# Patient Record
Sex: Female | Born: 1965 | Race: White | Hispanic: No | Marital: Single
Health system: Midwestern US, Community
[De-identification: ages and names within clinical notes are randomized; demographics above are authoritative.]

## PROBLEM LIST (undated history)

## (undated) DIAGNOSIS — K5900 Constipation, unspecified: Secondary | ICD-10-CM

## (undated) DIAGNOSIS — M7918 Myalgia, other site: Secondary | ICD-10-CM

## (undated) DIAGNOSIS — Z22322 Carrier or suspected carrier of Methicillin resistant Staphylococcus aureus: Secondary | ICD-10-CM

## (undated) DIAGNOSIS — M1990 Unspecified osteoarthritis, unspecified site: Secondary | ICD-10-CM

## (undated) HISTORY — DX: Carrier or suspected carrier of methicillin resistant Staphylococcus aureus: Z22.322

## (undated) HISTORY — DX: Myalgia, other site: M79.18

## (undated) HISTORY — PX: CARPAL TUNNEL RELEASE: SHX101

## (undated) HISTORY — DX: Constipation, unspecified: K59.00

## (undated) HISTORY — DX: Unspecified osteoarthritis, unspecified site: M19.90

---

## 2003-09-07 HISTORY — PX: TUBAL LIGATION: SHX77

## 2011-09-07 HISTORY — PX: HAND DEBRIDEMENT: SHX974

## 2013-09-06 HISTORY — PX: TRIGGER FINGER RELEASE: SHX641

## 2014-09-10 ENCOUNTER — Inpatient Hospital Stay
Admit: 2014-09-10 | Payer: BLUE CROSS/BLUE SHIELD | Attending: Rehabilitative and Restorative Service Providers" | Primary: Family Medicine

## 2014-09-10 DIAGNOSIS — M791 Myalgia: Secondary | ICD-10-CM

## 2014-09-10 NOTE — Progress Notes (Signed)
PT DAILY TREATMENT NOTE 8-14    Patient Name: Erika Duran  Date:09/10/2014  DOB: [birth year]    Patient DOB Verified  Payor: BLUE CROSS / Plan: [place] PPO / Product Type: PPO /    In time:201ut time:300  Total Treatment Time (min): 9  Visit #: 1of 15    Treatment Area: Myofascial pain syndrome, cervical [M79.1]    SUBJECTIVE  Pain Level (0-10 scale): 5/10  Any medication changes, allergies to medications, adverse drug reactions, diagnosis change, or new procedure performed?:  No     Yes (see summary sheet for update)  Subjective functional status/changes:    No changes reported  HPI: Pt c/o increased pressure in B ears, R>L. Reports she also suffers from headaches. States this has been an on and off thing for 20 years or more but that she had a flare up recently in the last year. States she was cleared w/ hearing and was told her ears were too clean. She was given wax to put in them 2x/day. Reports a heavy feeling in her ears that increase randomly and decrease w/ ice or heat and medications. Denies previous treatment for this or her neck/headaches. Denies previous surgery.     OBJECTIVE    Modality rationale: decrease inflammation, decrease pain and increase tissue extensibility to improve the patient???s ability to improve mobility    Min Type Additional Details     Estim: Att   Unatt        TENS instruct                  IFC  Premod   NMES                     Other:  w/US   w/ice   w/heat  Position:  Location:      Traction:  Cervical       Lumbar                        Prone          Supine                       Intermittent   Continuous Lbs:   before manual   after manual      Ultrasound: Continuous    Pulsed                           1MHz   3MHz Location:  W/cm2:      Iontophoresis with dexamethasone         Location:  Take home patch    In clinic   10   Ice       heat    Ice massage Position: supine  Location: neck      Laser    Other: Position:  Location:       Vasopneumatic Device Pressure:        lo  med  hi   Temperature:  lo  med  hi    Skin assessment post-treatment:  intact redness- no adverse reaction    redness ??? adverse reaction:     10 min Therapeutic Exercise:   See flow sheet : instructed in and demo'd HEP   Rationale: increase ROM, improve coordination and increase proprioception to improve the patient???s ability to decrease pain      min Therapeutic Activity:  See flow sheet :   Rationale:   to improve the patient???s ability to       min Neuromuscular Re-education:    See flow sheet :   Rationale:   to improve the patient???s ability to     25 min Manual Therapy:  SOR, MFR to B UT, LS, scalenes, and SCMs, TPR to B SCM and scalenes, MET for 1st rib elevation B   Rationale: decrease pain, increase ROM, increase tissue extensibility, decrease trigger points and increase postural awareness to improve function and mobility     min Gait Training:  ___ feet with ___ device on level surfaces with ___ level of assist   Rationale:          With    TE    TA    neuro    other: Patient Education:  Review HEP     Progressed/Changed HEP based on:    positioning    body mechanics    transfers    heat/ice application     other:      Other Objective/Functional Measures: Pt presents w/ rounded shoulders and forward head posture. Increased UT activation noted at rest. Cervical AROM Flex and ext WNL, R rot 50 pain free, 60 deg w/pain, L rot 62, R SB 46, L SB 32. B shoulder AROM WFL w/ pulling across her shoulders w/ flexion and ER behind the head. Increased myofascial restrictions noted throughout the cervical musculature B L>R. Compression of the R SCM TrP at the mastoid reproduced the ear heaviness described by the patient. Noted elevated 1st ribs B, and fair segmental mobility throughout the c/s.     Pain Level (0-10 scale) post treatment: 2/10    ASSESSMENT/Changes in Function: Pt responded well to manual listed above  and would benefit from dry needling to decrease active TrP in the cervical musculature. Focus on improving ROM and decreasing pain to improve function.     Patient will continue to benefit from skilled PT services to modify and progress therapeutic interventions, address functional mobility deficits, address ROM deficits, analyze and address soft tissue restrictions, analyze and cue movement patterns, assess and modify postural abnormalities and instruct in home and community integration to attain remaining goals.       See Plan of Care    See progress note/recertification    See Discharge Summary         Progress towards goals / Updated goals:  See POC    PLAN    Upgrade activities as tolerated       Continue plan of care    Update interventions per flow sheet         Discharge due to:_    Other: 3x/5 weeks    Barrie Dunker, PT 09/10/2014  3:55 PM

## 2014-09-10 NOTE — Progress Notes (Signed)
[facility] ??? [facility]              [address]        [place], [place] [address]  [phone]   [phone] fax    Plan of Care/ Statement of Necessity for Physical Therapy Services  Patient name: Erika Duran Start of Care: 09/10/2014   Referral source: Erika Cornea, MD DOB: 1966-03-19    Medical Diagnosis: Myofascial pain syndrome, cervical [M79.1]   Onset Date:1 year ago    Treatment Diagnosis: cervical myofascial pain   Prior Hospitalization: see medical history Provider#: [id number]   Medications: Verified on Patient summary List    Comorbidities: allergies, headaches   Prior Level of Function: Hx HA and ear infections     The Plan of Care and following information is based on the information from the initial evaluation.  Assessment/ key information: Pt is a 49 y/o female w/c/o increased pressure in B ears, R>L. Reports she also suffers from headaches. States this has been an on and off thing for 20 years or more but that she had a flare up recently in the last year. States she was cleared w/ hearing and was told her ears were too clean. She was given wax to put in them 2x/day. Reports a heavy feeling in her ears that increase randomly and decrease w/ ice or heat and medications. Denies previous treatment for this or her neck/headaches. Denies previous surgery. Pt presents w/ rounded shoulders and forward head posture. Increased UT activation noted at rest. Cervical AROM Flex and ext WNL, R rot 50 pain free, 60 deg w/pain, L rot 62, R SB 46, L SB 32. B shoulder AROM WFL w/ pulling across her shoulders w/ flexion and ER behind the head. Increased myofascial restrictions noted throughout the cervical musculature B L>R. Compression of the R SCM TrP at the mastoid reproduced the ear heaviness described by the patient. Noted elevated 1st ribs B, and fair segmental mobility throughout the c/s. Pt will benefit from skilled PT to address the deficits and progress with pt goals.      Problem List: pain affecting function, decrease ROM, decrease ADL/ functional abilitiies, decrease activity tolerance and decrease flexibility/ joint mobility   Treatment Plan may include any combination of the following: Therapeutic exercise, Therapeutic activities, Neuromuscular re-education, Physical agent/modality, Manual therapy and Patient education  Patient / Family readiness to learn indicated by: asking questions, trying to perform skills and interest  Persons(s) to be included in education: patient (P)  Barriers to Learning/Limitations: no  Patient Goal (s): ???relief???  Patient Self Reported Health Status: good  Rehabilitation Potential: good    Short Term Goals: To be accomplished in 2  weeks:  1. Pt will be independent and compliant w/ HEP to progress gains in PT.   2. Pt will report > or = to 25% improvement in ear symptoms for ease w/ ADLs.     Long Term Goals: To be accomplished in 5  weeks:  1. Pt will improve FOTO to > or = to 75 to demo improved function.   2. Pt will increase cervical AROM into L SB and R rotation by > or = to 8-10 deg and no increased pain for ease w/ driving.   3. Pt will improve cervical segmental mobility to good for ease w/ ADLs.   4. Pt will report > or = to 50% improvement in symptoms for ease w/ work tasks.    Frequency /  Duration: Patient to be seen 3 times per week for 5  weeks.    Patient/ Caregiver education and instruction: Diagnosis, prognosis, activity modification and exercises     Plan of care has been reviewed with PTA    Erika HeadingsJennifer  Jazari Duran, PT 09/10/2014 4:50 PM  ________________________________________________________________________    I certify that the above Therapy Services are being furnished while the patient is under my care. I agree with the treatment plan and certify that this therapy is necessary.    Physician's Signature:____________________  Date:____________Time: _________    Please sign and return to [facility] ??? [facility]               [address]        [place], [place] [id number]  [phone]   [phone] fax

## 2014-09-16 ENCOUNTER — Inpatient Hospital Stay: Admit: 2014-09-16 | Payer: BLUE CROSS/BLUE SHIELD | Primary: Family Medicine

## 2014-09-16 NOTE — Progress Notes (Signed)
PT DAILY TREATMENT NOTE 8-14    Patient Name: Erika Duran  Date:09/16/2014  DOB: 1966/02/11    Patient DOB Verified  Payor: BLUE CROSS / Plan: [place] PPO / Product Type: PPO /    In time:10:00  Out time:10:30  Total Treatment Time (min): 30  Visit #: 2 of 15    Treatment Area: Myofascial pain syndrome, cervical [M79.1]    SUBJECTIVE  Pain Level (0-10 scale): 5/10  Any medication changes, allergies to medications, adverse drug reactions, diagnosis change, or new procedure performed?:  No     Yes (see summary sheet for update)  Subjective functional status/changes:    No changes reported  "Yesterday it was a bad day, so I didn't do my ex."    OBJECTIVE    20 min Therapeutic Exercise:   See flow sheet :stretching and strengthening ex.   Rationale: increase ROM and increase strength to improve the patient???s ability to perform ADLs without difficulty.    10 min Manual Therapy:  Per flow sheet.   Rationale: decrease pain, increase ROM, increase tissue extensibility and decrease trigger points to perform ADLs without difficulty.     min Gait Training:  ___ feet with ___ device on level surfaces with ___ level of assist   Rationale:          With    TE    TA    neuro    other: Patient Education:  Review HEP     Progressed/Changed HEP based on:    positioning    body mechanics    transfers    heat/ice application     other:      Other Objective/Functional Measures:      Pain Level (0-10 scale) post treatment: 2/10    ASSESSMENT/Changes in Function: Initiated ex. Per flow sheet.  Pt reported a decrease in p! After therapy today.    Patient will continue to benefit from skilled PT services to address functional mobility deficits, address ROM deficits and address strength deficits to attain remaining goals.       See Plan of Care    See progress note/recertification    See Discharge Summary         Progress towards goals / Updated goals:    Short Term Goals: To be accomplished in 2?? weeks:   1. Pt will be independent and compliant w/ HEP to progress gains in PT. ??  MET. 09/16/14  2. Pt will report > or = to 25% improvement in ear symptoms for ease w/ ADLs.     Long Term Goals: To be accomplished in 5?? weeks:  1. Pt will improve FOTO to > or = to 75 to demo improved function. ??  2. Pt will increase cervical AROM into L SB and R rotation by > or = to 8-10 deg and no increased pain for ease w/ driving. ??  3. Pt will improve cervical segmental mobility to good for ease w/ ADLs. ??  4. Pt will report > or = to 50% improvement in symptoms for ease w/ work tasks.  PLAN    Upgrade activities as tolerated       Continue plan of care    Update interventions per flow sheet         Discharge due to:_    Other:_      Antionette Fairy, PTA 09/16/2014  10:22 AM

## 2014-09-17 ENCOUNTER — Inpatient Hospital Stay
Admit: 2014-09-17 | Payer: BLUE CROSS/BLUE SHIELD | Attending: Rehabilitative and Restorative Service Providers" | Primary: Family Medicine

## 2014-09-17 NOTE — Progress Notes (Signed)
PT DAILY TREATMENT NOTE 8-14    Patient Name: Erika Duran  Date:09/17/2014  DOB: 04-15-1966    Patient DOB Verified  Payor: BLUE CROSS / Plan: [place] PPO / Product Type: PPO /    In time:5:27  Out time:6:24  Total Treatment Time (min): 51  Visit #: 3 of 15    Treatment Area: Myofascial pain syndrome, cervical [M79.1]    SUBJECTIVE  Pain Level (0-10 scale): 10  Any medication changes, allergies to medications, adverse drug reactions, diagnosis change, or new procedure performed?:  No     Yes (see summary sheet for update)  Subjective functional status/changes:    No changes reported  Pt reported increase in P! This session. Pt states that progress is being made but notes that there are still flucuations in P! Levels throughout duration of day.     OBJECTIVE    Modality rationale: decrease inflammation, decrease pain and increase tissue extensibility to improve the patient???s ability to modulate pain, improve ROm, and relax tissues   Min Type Additional Details     Estim: Att   Unatt        TENS instruct                  IFC  Premod   NMES                     Other:  w/US   w/ice   w/heat  Position:  Location:      Traction:  Cervical       Lumbar                        Prone          Supine                       Intermittent   Continuous Lbs:   before manual   after manual      Ultrasound: Continuous    Pulsed                           1MHz   3MHz Location:  W/cm2:      Iontophoresis with dexamethasone         Location:  Take home patch    In clinic   10   Ice       heat    Ice massage Position:supine  Location:C-spine, Trap      Laser    Other: Position:  Location:      Vasopneumatic Device Pressure:        lo  med  hi   Temperature:  lo  med  hi    Skin assessment post-treatment:  intact redness- no adverse reaction    redness ??? adverse reaction:      32 min Therapeutic Exercise:   See flow sheet :   Rationale: increase ROM and increase strength to improve the patient???s  ability to function with ADLs and work.      min Therapeutic Activity:    See flow sheet :   Rationale:   to improve the patient???s ability to       min Neuromuscular Re-education:    See flow sheet :   Rationale:   to improve the patient???s ability to     15 min Manual Therapy:  SOR, MFR/TPR to Bilat SCM, scalene, longus coli,  UT; C/S PAs and downglides   Rationale: increase ROM, increase tissue extensibility, decrease trigger points and increase postural awareness to improve pt awareness of posture, improve C-spine ROM, and to improve P!.      min Gait Training:  ___ feet with ___ device on level surfaces with ___ level of assist   Rationale:          With    TE    TA    neuro    other: Patient Education:  Review HEP     Progressed/Changed HEP based on:    positioning    body mechanics    transfers    heat/ice application     other:      Other Objective/Functional Measures:      Pain Level (0-10 scale) post treatment: 0    ASSESSMENT/Changes in Function: Pt reported significant decrease in P! Following therapeutic interventions. Pt noted improved ROM of neck and decreased stiffness.     Patient will continue to benefit from skilled PT services to address functional mobility deficits, address ROM deficits and address strength deficits to attain remaining goals.       See Plan of Care    See progress note/recertification    See Discharge Summary         Progress towards goals / Updated goals:  Short Term Goals: To be accomplished in 2?? weeks:  1. Pt will be independent and compliant w/ HEP to progress gains in PT. ??  MET. 09/16/14  2. Pt will report > or = to 25% improvement in ear symptoms for ease w/ ADLs.     Long Term Goals: To be accomplished in 5?? weeks:  1. Pt will improve FOTO to > or = to 75 to demo improved function. ??  2. Pt will increase cervical AROM into L SB and R rotation by > or = to 8-10 deg and no increased pain for ease w/ driving. ??   3. Pt will improve cervical segmental mobility to good for ease w/ ADLs. ??  4. Pt will report > or = to 50% improvement in symptoms for ease w/ work tasks.    PLAN    Upgrade activities as tolerated       Continue plan of care    Update interventions per flow sheet         Discharge due to:_    Other:_      Nancy Fetter 09/17/2014  6:17 PM

## 2014-09-19 ENCOUNTER — Inpatient Hospital Stay: Admit: 2014-09-19 | Payer: BLUE CROSS/BLUE SHIELD | Primary: Family Medicine

## 2014-09-19 NOTE — Progress Notes (Signed)
PT DAILY TREATMENT NOTE 8-14    Patient Name: Erika Duran  Date:09/19/2014  DOB: 03/04/1966    Patient DOB Verified  Payor: BLUE CROSS / Plan: [place] PPO / Product Type: PPO /    In time:1:58 Out time: 2:58  Total Treatment Time (min): 60  Visit #: 4 of 15    Treatment Area: Myofascial pain syndrome, cervical [M79.1]    SUBJECTIVE  Pain Level (0-10 scale): 0  Any medication changes, allergies to medications, adverse drug reactions, diagnosis change, or new procedure performed?:  No     Yes (see summary sheet for update)  Subjective functional status/changes:    No changes reported  Reports "I have a lingering HA in the back of my head but it is not bad".     OBJECTIVE    Modality rationale: decrease inflammation, decrease pain and increase tissue extensibility to improve the patient???s ability to perform work activities   Min Type Additional Details     Estim: Att   Unatt        TENS instruct                  IFC  Premod   NMES                     Other:  w/US   w/ice   w/heat  Position:  Location:      Traction:  Cervical       Lumbar                        Prone          Supine                       Intermittent   Continuous Lbs:   before manual   after manual      Ultrasound: Continuous    Pulsed                           1MHz   3MHz Location:  W/cm2:      Iontophoresis with dexamethasone         Location:  Take home patch    In clinic   10   Ice       heat    Ice massage Position: Seated  Location:neck      Laser    Other: Position:  Location:      Vasopneumatic Device Pressure:        lo  med  hi   Temperature:  lo  med  hi    Skin assessment post-treatment:  intact redness- no adverse reaction    redness ??? adverse reaction:      40 min Therapeutic Exercise:   See flow sheet :   Rationale: increase ROM and increase strength to improve the patient's ability to perform ADLs.     10 min Manual Therapy:  SOR, MFR/TPR to B SCM, LS and UT, C/S PAs    Rationale: increase ROM, increase tissue extensibility, decrease trigger points and increase postural awareness to improve tolerance to functional activities          With    TE    TA    neuro    other: Patient Education:  Review HEP     Progressed/Changed HEP based on:    positioning    body mechanics  transfers    heat/ice application     other:      Other Objective/Functional Measures: Tightness and trigger points noted during manual therapy in B UTs.      Pain Level (0-10 scale) post treatment: 0    ASSESSMENT/Changes in Function: no increase in pain with exercises performed today. Reported "I feel more loose" after manual therapy. Reported that the "lingering headache" the pt had at the beginning of the session "is mostly gone now" at the end of the session. Continue POC as tolerated.     Patient will continue to benefit from skilled PT services to address functional mobility deficits, address ROM deficits and address strength deficits to attain remaining goals.       See Plan of Care    See progress note/recertification    See Discharge Summary         Progress towards goals / Updated goals:  Short Term Goals: To be accomplished in 2?? weeks:  1. Pt will be independent and compliant w/ HEP to progress gains in PT. ??  MET. 09/16/14  2. Pt will report > or = to 25% improvement in ear symptoms for ease w/ ADLs.   Long Term Goals: To be accomplished in 5?? weeks:  1. Pt will improve FOTO to > or = to 75 to demo improved function. ??  2. Pt will increase cervical AROM into L SB and R rotation by > or = to 8-10 deg and no increased pain for ease w/ driving. ??  3. Pt will improve cervical segmental mobility to good for ease w/ ADLs. ??  4. Pt will report > or = to 50% improvement in symptoms for ease w/ work tasks.    PLAN    Upgrade activities as tolerated       Continue plan of care    Update interventions per flow sheet         Discharge due to:_    Other:_      Stephanie Coup, PT 09/19/2014  2:00 PM

## 2014-09-24 ENCOUNTER — Inpatient Hospital Stay: Admit: 2014-09-24 | Payer: BLUE CROSS/BLUE SHIELD | Primary: Family Medicine

## 2014-09-24 NOTE — Progress Notes (Signed)
PT DAILY TREATMENT NOTE 8-14    Patient Name: Erika Duran  Date:09/24/2014  DOB: [birth year]    Patient DOB Verified  Payor: BLUE CROSS / Plan: [place] PPO / Product Type: PPO /    In time:12:27  Out time:1:31  Total Treatment Time (min): 64  Visit #: 5 of 15    Treatment Area: Myofascial pain syndrome, cervical [M79.1]    SUBJECTIVE  Pain Level (0-10 scale): 0/10  Any medication changes, allergies to medications, adverse drug reactions, diagnosis change, or new procedure performed?:  No     Yes (see summary sheet for update)  Subjective functional status/changes:    No changes reported  Pt states she is sore from overstretching her arm the other day.     OBJECTIVE    Modality rationale: decrease pain to improve the patient???s ability to perform ADLs.    Min Type Additional Details     Estim: Att   Unatt        TENS instruct                  IFC  Premod   NMES                     Other:  w/US   w/ice   w/heat  Position:  Location:      Traction:  Cervical       Lumbar                        Prone          Supine                       Intermittent   Continuous Lbs:   before manual   after manual      Ultrasound: Continuous    Pulsed                           1MHz   3MHz Location:  W/cm2:      Iontophoresis with dexamethasone         Location:  Take home patch    In clinic   9   Ice       heat    Ice massage Position: supine  Location: c/s      Laser    Other: Position:  Location:      Vasopneumatic Device Pressure:        lo  med  hi   Temperature:  lo  med  hi    Skin assessment post-treatment:  intact redness- no adverse reaction    redness ??? adverse reaction:     45 min Therapeutic Exercise:   See flow sheet :   Rationale: increase ROM and increase strength to improve the patient???s ability to perform ADLs.     10 min Manual Therapy:  Per flow sheet   Rationale: decrease pain, increase ROM and increase tissue extensibility to increase ease of  ADLs.     Other Objective/Functional Measures:  Pt reports 30% improvement since beginning PT.      Pain Level (0-10 scale) post treatment: 3/10    ASSESSMENT/Changes in Function:  Cont per POC    Patient will continue to benefit from skilled PT services to modify and progress therapeutic interventions, address functional mobility deficits, address ROM deficits, address strength deficits, analyze and address soft tissue restrictions  and analyze and cue movement patterns to attain remaining goals.       See Plan of Care    See progress note/recertification    See Discharge Summary         Progress towards goals / Updated goals:  Short Term Goals: To be accomplished in 2?? weeks:  1. Pt will be independent and compliant w/ HEP to progress gains in PT. ??  MET. 09/16/14  2. Pt will report > or = to 25% improvement in ear symptoms for ease w/ ADLs.   Met, 30% reported.  09/24/14??     Long Term Goals: To be accomplished in 5?? weeks:  1. Pt will improve FOTO to > or = to 75 to demo improved function. ??  2. Pt will increase cervical AROM into L SB and R rotation by > or = to 8-10 deg and no increased pain for ease w/ driving. ??  3. Pt will improve cervical segmental mobility to good for ease w/ ADLs. ??  4. Pt will report > or = to 50% improvement in symptoms for ease w/ work tasks.    PLAN    Upgrade activities as tolerated       Continue plan of care    Update interventions per flow sheet         Discharge due to:_    Other:_      Selenne Coggin E Mellina Benison, PTA 09/24/2014  12:37 PM

## 2014-09-26 ENCOUNTER — Inpatient Hospital Stay: Admit: 2014-09-26 | Payer: BLUE CROSS/BLUE SHIELD | Primary: Family Medicine

## 2014-09-26 NOTE — Progress Notes (Signed)
PT DAILY TREATMENT NOTE 8-14    Patient Name: Erika Duran  Date:09/26/2014  DOB: 1966-04-03    Patient DOB Verified  Payor: BLUE CROSS / Plan: [place] PPO / Product Type: PPO /    In time: 3:30  Out time:4:12  Total Treatment Time (min): 42  Visit #: 6 of 15    Treatment Area: Myofascial pain syndrome, cervical [M79.1]    SUBJECTIVE  Pain Level (0-10 scale): 0  Any medication changes, allergies to medications, adverse drug reactions, diagnosis change, or new procedure performed?:  No     Yes (see summary sheet for update)  Subjective functional status/changes:    No changes reported  Pt reports no current pain.    OBJECTIVE    Modality rationale:  to improve the patient???s ability to    Min Type Additional Details     Estim: Att   Unatt        TENS instruct                  IFC  Premod   NMES                     Other:  w/US   w/ice   w/heat  Position:  Location:      Traction:  Cervical       Lumbar                        Prone          Supine                       Intermittent   Continuous Lbs:   before manual   after manual      Ultrasound: Continuous    Pulsed                           1MHz   3MHz Location:  W/cm2:      Iontophoresis with dexamethasone         Location:  Take home patch    In clinic      Ice       heat    Ice massage Position:  Location:      Laser    Other: Position:  Location:      Vasopneumatic Device Pressure:        lo  med  hi   Temperature:  lo  med  hi    Skin assessment post-treatment:  intact redness- no adverse reaction    redness ??? adverse reaction:     34 min Therapeutic Exercise:   See flow sheet :   Rationale: increase ROM, increase strength and improve coordination to improve the patient???s ability to perform ADL's     min Therapeutic Activity:    See flow sheet :   Rationale:   to improve the patient???s ability to       min Neuromuscular Re-education:    See flow sheet :   Rationale:   to improve the patient???s ability to      8 min Manual Therapy:  Per flow sheet.   Rationale: decrease pain, increase ROM, increase tissue extensibility and decrease trigger points to increase ease with work duties.     min Gait Training:  ___ feet with ___ device on level surfaces with ___ level of assist   Rationale:  With    TE    TA    neuro    other: Patient Education:  Review HEP     Progressed/Changed HEP based on:    positioning    body mechanics    transfers    heat/ice application     other:      Other Objective/Functional Measures:      Pain Level (0-10 scale) post treatment: 3    ASSESSMENT/Changes in Function: cont per POC    Patient will continue to benefit from skilled PT services to modify and progress therapeutic interventions, address functional mobility deficits, address ROM deficits, address strength deficits, analyze and address soft tissue restrictions, analyze and cue movement patterns, assess and modify postural abnormalities and instruct in home and community integration to attain remaining goals.       See Plan of Care    See progress note/recertification    See Discharge Summary         Progress towards goals / Updated goals:  Short Term Goals: To be accomplished in 2?? weeks:  1. Pt will be independent and compliant w/ HEP to progress gains in PT. ??  MET. 09/16/14  2. Pt will report > or = to 25% improvement in ear symptoms for ease w/ ADLs. ??  Met, 30% reported.?? 09/24/14??     Long Term Goals: To be accomplished in 5?? weeks:  1. Pt will improve FOTO to > or = to 75 to demo improved function. Reassess NV- pt TC. (09/26/14)??  2. Pt will increase cervical AROM into L SB and R rotation by > or = to 8-10 deg and no increased pain for ease w/ driving. ??  3. Pt will improve cervical segmental mobility to good for ease w/ ADLs. ??  4. Pt will report > or = to 50% improvement in symptoms for ease w/ work tasks.      PLAN    Upgrade activities as tolerated       Continue plan of care    Update interventions per flow sheet          Discharge due to:_    Other:_      Vicenta Dunning, PT 09/26/2014  4:05 PM

## 2014-09-27 ENCOUNTER — Encounter: Payer: BLUE CROSS/BLUE SHIELD | Primary: Family Medicine

## 2014-09-30 ENCOUNTER — Inpatient Hospital Stay: Admit: 2014-09-30 | Payer: BLUE CROSS/BLUE SHIELD | Primary: Family Medicine

## 2014-09-30 NOTE — Progress Notes (Signed)
PT DAILY TREATMENT NOTE 8-14    Patient Name: Erika Duran  Date:09/30/2014  DOB: 10-09-1965    Patient DOB Verified  Payor: BLUE CROSS / Plan: [place] PPO / Product Type: PPO /    In time:328  Out time:325  Total Treatment Time (min): 77  Visit #: 7 of 15    Treatment Area: Myofascial pain syndrome, cervical [M79.1]    SUBJECTIVE  Pain Level (0-10 scale): 0/10  Any medication changes, allergies to medications, adverse drug reactions, diagnosis change, or new procedure performed?:  No     Yes (see summary sheet for update)  Subjective functional status/changes:    No changes reported  Patient reports that she is not having any pain this afternoon.    OBJECTIVE      47 min Therapeutic Exercise:   See flow sheet :   Rationale: increase ROM, increase strength and improve coordination to improve the patient???s ability to perform functional activities with greater ease.    10 min Manual Therapy:  See flow sheet   Rationale: increase tissue extensibility to increase ease of ADLs            With    TE    TA    neuro    other: Patient Education:  Review HEP     Progressed/Changed HEP based on:    positioning    body mechanics    transfers    heat/ice application     other:      Other Objective/Functional Measures:      Pain Level (0-10 scale) post treatment: 3/10    ASSESSMENT/Changes in Function: Patient able to complete exercise program without having pain. Stiffness persists along the left side of the neck.     Patient will continue to benefit from skilled PT services to modify and progress therapeutic interventions, address functional mobility deficits, address ROM deficits, address strength deficits, analyze and address soft tissue restrictions, analyze and cue movement patterns and assess and modify postural abnormalities to attain remaining goals.       See Plan of Care    See progress note/recertification    See Discharge Summary         Progress towards goals / Updated goals:   Short Term Goals: To be accomplished in 2?? weeks:  1. Pt will be independent and compliant w/ HEP to progress gains in PT. ??  MET. 09/16/14  2. Pt will report > or = to 25% improvement in ear symptoms for ease w/ ADLs. ??  Met, 30% reported.?? 09/24/14??     Long Term Goals: To be accomplished in 5?? weeks:  1. Pt will improve FOTO to > or = to 75 to demo improved function. Reassess NV- pt TC. (09/26/14)??  2. Pt will increase cervical AROM into L SB and R rotation by > or = to 8-10 deg and no increased pain for ease w/ driving. ??  3. Pt will improve cervical segmental mobility to good for ease w/ ADLs. ??  4. Pt will report > or = to 50% improvement in symptoms forease w/ work tasks.      PLAN    Upgrade activities as tolerated       Continue plan of care    Update interventions per flow sheet         Discharge due to:_    Other:_      Jerilee Hoh Aziah Kaiser, PT 09/30/2014  3:28 PM

## 2014-10-03 ENCOUNTER — Encounter: Payer: BLUE CROSS/BLUE SHIELD | Primary: Family Medicine

## 2014-10-03 ENCOUNTER — Inpatient Hospital Stay: Admit: 2014-10-03 | Payer: BLUE CROSS/BLUE SHIELD | Primary: Family Medicine

## 2014-10-03 NOTE — Progress Notes (Signed)
PT DAILY TREATMENT NOTE 8-14    Patient Name: Erika Duran  Date:10/03/2014  DOB: 10/22/1965    Patient DOB Verified  Payor: BLUE CROSS / Plan: [place] PPO / Product Type: PPO /    In time:12:02  Out time:12:44  Total Treatment Time (min): 42  Visit #: 8 of 15    Treatment Area: Myofascial pain syndrome, cervical [M79.1]    SUBJECTIVE  Pain Level (0-10 scale): 3/10  Any medication changes, allergies to medications, adverse drug reactions, diagnosis change, or new procedure performed?:  No     Yes (see summary sheet for update)  Subjective functional status/changes:    No changes reported  Pt states she feels more tense today.     OBJECTIVE    Modality rationale:    Min Type Additional Details     Estim: Att   Unatt        TENS instruct                  IFC  Premod   NMES                     Other:  w/US   w/ice   w/heat  Position:  Location:      Traction:  Cervical       Lumbar                        Prone          Supine                       Intermittent   Continuous Lbs:   before manual   after manual      Ultrasound: Continuous    Pulsed                           1MHz   3MHz Location:  W/cm2:      Iontophoresis with dexamethasone         Location:  Take home patch    In clinic      Ice       heat    Ice massage Position:  Location:      Laser    Other: Position:  Location:      Vasopneumatic Device Pressure:        lo  med  hi   Temperature:  lo  med  hi    Skin assessment post-treatment:  intact redness- no adverse reaction    redness ??? adverse reaction:     32 min Therapeutic Exercise:   See flow sheet :   Rationale: increase ROM and increase strength to improve the patient???s ability to perform ADLs.     10 min Manual Therapy:  Per flow sheet   Rationale: decrease pain, increase ROM and increase tissue extensibility to increase ease of ADLs.     Other Objective/Functional Measures: Significant TP in B UTs.      Pain Level (0-10 scale) post treatment: 3/10     ASSESSMENT/Changes in Function: Cont per POC    Patient will continue to benefit from skilled PT services to modify and progress therapeutic interventions, address functional mobility deficits, address ROM deficits and address strength deficits to attain remaining goals.       See Plan of Care    See progress note/recertification    See Discharge Summary  Progress towards goals / Updated goals:  Short Term Goals: To be accomplished in 2?? weeks:  1. Pt will be independent and compliant w/ HEP to progress gains in PT. ??  MET. 09/16/14  2. Pt will report > or = to 25% improvement in ear symptoms for ease w/ ADLs. ??  Met, 30% reported.?? 09/24/14??     Long Term Goals: To be accomplished in 5?? weeks:  1. Pt will improve FOTO to > or = to 75 to demo improved function.  Regressed, FOTO 54 a decrease of 20. (09/30/14)??  2. Pt will increase cervical AROM into L SB and R rotation by > or = to 8-10 deg and no increased pain for ease w/ driving. ??  3. Pt will improve cervical segmental mobility to good for ease w/ ADLs. ??  4. Pt will report > or = to 50% improvement in symptoms forease w/ work tasks.??     PLAN    Upgrade activities as tolerated       Continue plan of care    Update interventions per flow sheet         Discharge due to:_    Other:_      Demetri Kerman E Gergory Biello, PTA 10/03/2014  12:50 PM

## 2014-10-04 ENCOUNTER — Inpatient Hospital Stay
Admit: 2014-10-04 | Payer: BLUE CROSS/BLUE SHIELD | Attending: Rehabilitative and Restorative Service Providers" | Primary: Family Medicine

## 2014-10-04 NOTE — Progress Notes (Signed)
PT DAILY TREATMENT NOTE 8-14    Patient Name: Erika Duran  Date:10/04/2014  DOB: 1966-07-27    Patient DOB Verified  Payor: BLUE CROSS / Plan: [place] PPO / Product Type: PPO /    In time:11:02  Out time:11:55  Total Treatment Time (min): 34  Visit #: 9 of 15    Treatment Area: Myofascial pain syndrome, cervical [M79.1]    SUBJECTIVE  Pain Level (0-10 scale): 5/10  Any medication changes, allergies to medications, adverse drug reactions, diagnosis change, or new procedure performed?:  No     Yes (see summary sheet for update)  Subjective functional status/changes:    No changes reported  Pt notes increased tightness on c/s mm, R more than L.     OBJECTIVE    Modality rationale: decrease inflammation and decrease pain to improve the patient???s ability to improve pain of C/S.    Min Type Additional Details     Estim: Att   Unatt        TENS instruct                  IFC  Premod   NMES                     Other:  w/US   w/ice   w/heat  Position:  Location:      Traction:  Cervical       Lumbar                        Prone          Supine                       Intermittent   Continuous Lbs:   before manual   after manual      Ultrasound: Continuous    Pulsed                           1MHz   3MHz Location:  W/cm2:      Iontophoresis with dexamethasone         Location:  Take home patch    In clinic   10   Ice       heat    Ice massage Position:Supine  Location: Post Bilat Neck      Laser    Other: Position:  Location:      Vasopneumatic Device Pressure:        lo  med  hi   Temperature:  lo  med  hi    Skin assessment post-treatment:  intact redness- no adverse reaction    redness ??? adverse reaction:     33 min Therapeutic Exercise:   See flow sheet : Increased noted on flow sheet   Rationale: increase ROM and increase strength to improve the patient???s ability to improve ease of motion.      min Therapeutic Activity:    See flow sheet :   Rationale:   to improve the patient???s ability to        min Neuromuscular Re-education:    See flow sheet :   Rationale:   to improve the patient???s ability to     10 min Manual Therapy:  SOR, MFR/TPR to B SCM, [place], [place]; C/s PA's and downglides   Rationale: decrease pain, increase ROM, increase tissue extensibility and decrease trigger points  to improve ease of motion of C/S     min Gait Training:  ___ feet with ___ device on level surfaces with ___ level of assist   Rationale:          With    TE    TA    neuro    other: Patient Education:  Review HEP     Progressed/Changed HEP based on:    positioning    body mechanics    transfers    heat/ice application     other:      Other Objective/Functional Measures: ROM: C/S R SB 45 degrees, L SB 35 degrees, R Rotation 65 degrees, L Rotation 60    Pain Level (0-10 scale) post treatment: 3/10    ASSESSMENT/Changes in Function: Pt continued to show progress with skilled therapy. Pt has shown improvement in pain free ROM and awareness of posture. Pt able to perform exercise without increase in pain. Con't w/ POC.     Patient will continue to benefit from skilled PT services to modify and progress therapeutic interventions, address functional mobility deficits, address ROM deficits, address strength deficits, analyze and address soft tissue restrictions, analyze and modify body mechanics/ergonomics and assess and modify postural abnormalities to attain remaining goals.       See Plan of Care    See progress note/recertification    See Discharge Summary         Progress towards goals / Updated goals:  Short Term Goals: To be accomplished in 2?? weeks:  1. Pt will be independent and compliant w/ HEP to progress gains in PT. ??  MET. 09/16/14  2. Pt will report > or = to 25% improvement in ear symptoms for ease w/ ADLs. ??  Met, 30% reported.?? 09/24/14??     Long Term Goals: To be accomplished in 5?? weeks:  1. Pt will improve FOTO to > or = to 75 to demo improved function.  Regressed, FOTO 54 a decrease of 20. (09/30/14)??   2. Pt will increase cervical AROM into L SB and R rotation by > or = to 8-10 deg and no increased pain for ease w/ driving. ??Progressing: Improve L SB by 3 to 35 degrees and R Rotation by 5 degrees to 65 degrees. 10/04/14  3. Pt will improve cervical segmental mobility to good for ease w/ ADLs. ??  4. Pt will report > or = to 50% improvement in symptoms forease w/ work tasks.??     PLAN    Upgrade activities as tolerated       Continue plan of care    Update interventions per flow sheet         Discharge due to:_    Other:_      Nancy Fetter 10/04/2014  11:06 AM

## 2014-10-07 ENCOUNTER — Inpatient Hospital Stay: Admit: 2014-10-07 | Payer: BLUE CROSS/BLUE SHIELD | Primary: Family Medicine

## 2014-10-07 DIAGNOSIS — M791 Myalgia: Secondary | ICD-10-CM

## 2014-10-07 NOTE — Progress Notes (Signed)
[facility] ??? [facility]              [address] [place] [address]  [phone]   [phone] fax    Progress Note  Patient name: Erika Duran Start of Care: 09/11/14   Referral source: Vassie Loll, MD DOB: [birth year]   Medical/Treatment Diagnosis: Myofascial pain syndrome, cervical [M79.1] Onset Date:1 year ago     Prior Hospitalization: see medical history Provider#: [id number]   Medications: Verified on Patient Summary List    Comorbidities: allergies, headaches  ??Prior Level of Function: Hx HA and ear infections  Visits from Start of Care: 10    Missed Visits: 0    Established Goals:        Excellent         Good         Limited            None   Increased ROM            Increased Strength           Increased Mobility            Decreased Pain            Decreased Swelling            Key Functional Changes:   Progress towards goals / Updated goals:  Short Term Goals: To be accomplished in 2?? weeks:  1. Pt will be independent and compliant w/ HEP to progress gains in PT. ??MET. 09/16/14  2. Pt will report > or = to 25% improvement in ear symptoms for ease w/ ADLs. Met, 30% reported.?? 09/24/14??   Long Term Goals: To be accomplished in 5?? weeks:  1. Pt will improve FOTO to > or = to 75 to demo improved function. Regressed, FOTO 54 a decrease of 20. (09/30/14)??  2. Pt will increase cervical AROM into L SB and R rotation by > or = to 8-10 deg and no increased pain for ease w/ driving. ??Progressing: Improve L SB by 3 to 35 degrees and R Rotation by 5 degrees to 65 degrees. 10/04/14  3. Pt will improve cervical segmental mobility to good for ease w/ ADLs. ??Goal met.?? 10/07/14??  4. Pt will report > or = to 50% improvement in symptoms forease w/ work tasks. Goal met. 50% reported.?? 10/07/14    Pt is progressing with PT. 2 of 4 LTGs have been met.?? Pt reports 50% improvement since beginning PT.?? Cervical segmental mobility has  improved.?? Cervical AROM: L SB 35, R rot 65 degrees.?? FOTO score is 54, a regression of 20. Pt reports ear symptoms have also improved.??Patient will continue to benefit from skilled PT services to modify and progress therapeutic interventions, address functional mobility deficits, address ROM deficits and address strength deficits to attain remaining goals.    Updated Goals: to be achieved in 4 weeks:  Continue with LTG #1 & 2 above and add the following  3. 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     ASSESSMENT/RECOMMENDATIONS:  Continue therapy per initial plan/protocol at a frequency of 2- 3 x per week for 4 weeks  Continue therapy with the following recommended changes:_____________________      _____________________________________________________________________  Discontinue therapy progressing towards or have reached established goals  Discontinue therapy due to lack of appreciable progress towards goals  Discontinue therapy due to lack of attendance or compliance  Await Physician's recommendations/decisions regarding therapy  Other:________________________________________________________________    Thank you for this referral.    Barrie Dunker, PT 10/07/2014 5:21 PM  NOTE TO PHYSICIAN:  PLEASE COMPLETE THE ORDERS BELOW AND   FAX TO [facility]: [phone]  If you are unable to process this request in 24 hours please contact our office: [phone]      I have read the above report and request that my patient continue as recommended.    I have read the above report and request that my patient continue therapy with the following changes/special instructions:________________________________________  I have read the above report and request that my patient be discharged from therapy.    Physician???s signature: ________________________________Date: _____Time:_____

## 2014-10-07 NOTE — Progress Notes (Signed)
PT DAILY TREATMENT NOTE 8-14    Patient Name: Erika Duran  Date:10/07/2014  DOB: [birth year]    Patient DOB Verified  Payor: BLUE CROSS / Plan: [place] PPO / Product Type: PPO /    In time:9:56  Out time:10:43  Total Treatment Time (min): 27  Visit #: 10 of 15    Treatment Area: Myofascial pain syndrome, cervical [M79.1]    SUBJECTIVE  Pain Level (0-10 scale): 0/10  Any medication changes, allergies to medications, adverse drug reactions, diagnosis change, or new procedure performed?:  No     Yes (see summary sheet for update)  Subjective functional status/changes:    No changes reported  Pt states she feels like she has more motion.     OBJECTIVE    Modality rationale: PD   Min Type Additional Details     Estim: Att   Unatt        TENS instruct                  IFC  Premod   NMES                     Other:  w/US   w/ice   w/heat  Position:  Location:      Traction:  Cervical       Lumbar                        Prone          Supine                       Intermittent   Continuous Lbs:   before manual   after manual      Ultrasound: Continuous    Pulsed                           1MHz   3MHz Location:  W/cm2:      Iontophoresis with dexamethasone         Location:  Take home patch    In clinic      Ice       heat    Ice massage Position:  Location:      Laser    Other: Position:  Location:      Vasopneumatic Device Pressure:        lo  med  hi   Temperature:  lo  med  hi    Skin assessment post-treatment:  intact redness- no adverse reaction    redness ??? adverse reaction:     37 min Therapeutic Exercise:   See flow sheet :   Rationale: increase ROM and increase strength to improve the patient???s ability to perform ADLs.     10 min Manual Therapy:  Per flow sheet   Rationale: decrease pain, increase ROM and increase tissue extensibility to increase ease of ADLs.     Other Objective/Functional Measures:  Pt reports 50% improvement since beginning PT.      Pain Level (0-10 scale) post treatment: 0/10     ASSESSMENT/Changes in Function: Pt is progressing with PT. 2 of 4 LTGs have been met.  Pt reports 50% improvement since beginning PT.  Cervical segmental mobility has improved.  Cervical AROM: L SB 35, R rot 65 degrees.  FOTO score is 54, a regression of 20. Pt reports ear symptoms have also improved.  Patient will continue to benefit from skilled PT services to modify and progress therapeutic interventions, address functional mobility deficits, address ROM deficits and address strength deficits to attain remaining goals.       See Plan of Care    See progress note/recertification    See Discharge Summary         Progress towards goals / Updated goals:  Short Term Goals: To be accomplished in 2?? weeks:  1. Pt will be independent and compliant w/ HEP to progress gains in PT. ??  MET. 09/16/14  2. Pt will report > or = to 25% improvement in ear symptoms for ease w/ ADLs. ??  Met, 30% reported.?? 09/24/14??     Long Term Goals: To be accomplished in 5?? weeks:  1. Pt will improve FOTO to > or = to 75 to demo improved function.  Regressed, FOTO 54 a decrease of 20. (09/30/14)??  2. Pt will increase cervical AROM into L SB and R rotation by > or = to 8-10 deg and no increased pain for ease w/ driving. ??Progressing: Improve L SB by 3 to 35 degrees and R Rotation by 5 degrees to 65 degrees. 10/04/14  3. Pt will improve cervical segmental mobility to good for ease w/ ADLs.   Goal met.  10/07/14??  4. Pt will report > or = to 50% improvement in symptoms forease w/ work tasks.  Goal met. 50% reported.  10/07/14    PLAN    Upgrade activities as tolerated       Continue plan of care    Update interventions per flow sheet         Discharge due to:_    Other:_      Kainat Pizana E Rupa Lagan, PTA 10/07/2014  11:53 AM

## 2014-10-09 ENCOUNTER — Encounter: Payer: BLUE CROSS/BLUE SHIELD | Primary: Family Medicine

## 2014-10-11 ENCOUNTER — Inpatient Hospital Stay
Admit: 2014-10-11 | Payer: BLUE CROSS/BLUE SHIELD | Attending: Rehabilitative and Restorative Service Providers" | Primary: Family Medicine

## 2014-10-11 NOTE — Progress Notes (Signed)
PT DAILY TREATMENT NOTE 8-14    Patient Name: Alba Perillo  Date:10/11/2014  DOB: [birth year]    Patient DOB Verified  Payor: BLUE CROSS / Plan: [place] PPO / Product Type: PPO /    In time:10:03  Out time:11:00  Total Treatment Time (min): 34  Visit #: 11 of 15    Treatment Area: Myofascial pain syndrome, cervical [M79.1]    SUBJECTIVE  Pain Level (0-10 scale): 0/10  Any medication changes, allergies to medications, adverse drug reactions, diagnosis change, or new procedure performed?:  No     Yes (see summary sheet for update)  Subjective functional status/changes:    No changes reported  Pt reports that she has not been out of the house since Tuesday d/t feeling under the weather. Pt states that she feels better upon leaving therapy.     OBJECTIVE    Modality rationale: decrease inflammation, decrease pain and increase tissue extensibility to improve the patient???s ability to pain free ROM.    Min Type Additional Details     Estim: Att   Unatt        TENS instruct                  IFC  Premod   NMES                     Other:  w/US   w/ice   w/heat  Position:  Location:      Traction:  Cervical       Lumbar                        Prone          Supine                       Intermittent   Continuous Lbs:   before manual   after manual      Ultrasound: Continuous    Pulsed                           1MHz   3MHz Location:  W/cm2:      Iontophoresis with dexamethasone         Location:  Take home patch    In clinic   10   Ice       heat    Ice massage Position:Supine  Location: Post Neck and shoulders      Laser    Other: Position:  Location:      Vasopneumatic Device Pressure:        lo  med  hi   Temperature:  lo  med  hi    Skin assessment post-treatment:  intact redness- no adverse reaction    redness ??? adverse reaction:     37 min Therapeutic Exercise:   See flow sheet :   Rationale: increase ROM and increase strength to improve the patient???s ability to performance with ADLs      min Therapeutic Activity:    See flow sheet :   Rationale:   to improve the patient???s ability to       min Neuromuscular Re-education:    See flow sheet :   Rationale:   to improve the patient???s ability to     12 min Manual Therapy:  SOR, MFR/TPR to Bilat SCM, Scalenes, Longus colli, UT, C/S PAs and downglides  Rationale: decrease pain, increase ROM, increase tissue extensibility and decrease trigger points to improve ease of motion and ADLs.      min Gait Training:  ___ feet with ___ device on level surfaces with ___ level of assist   Rationale:          With    TE    TA    neuro    other: Patient Education:  Review HEP     Progressed/Changed HEP based on:    positioning    body mechanics    transfers    heat/ice application     other:      Other Objective/Functional Measures:     Pain Level (0-10 scale) post treatment: 1/10    ASSESSMENT/Changes in Function: Pt con't to show progress with skilled therapy. Pt has shown improvement with pain free ROM and improve mm tightness in C/S mm. Con't w/ POC.    Patient will continue to benefit from skilled PT services to modify and progress therapeutic interventions, address functional mobility deficits, address ROM deficits, address strength deficits, analyze and address soft tissue restrictions, analyze and cue movement patterns and analyze and modify body mechanics/ergonomics to attain remaining goals.       See Plan of Care    See progress note/recertification    See Discharge Summary         Progress towards goals / Updated goals:  Short Term Goals: To be accomplished in 2?? weeks:  1. Pt will be independent and compliant w/ HEP to progress gains in PT. ??  MET. 09/16/14  2. Pt will report > or = to 25% improvement in ear symptoms for ease w/ ADLs. ??  Met, 30% reported.?? 09/24/14??     Long Term Goals: To be accomplished in 5?? weeks:  1. Pt will improve FOTO to > or = to 75 to demo improved function.  Regressed, FOTO 54 a decrease of 20. (09/30/14)??   2. Pt will increase cervical AROM into L SB and R rotation by > or = to 8-10 deg and no increased pain for ease w/ driving. ??Progressing: Improve L SB by 3 to 35 degrees and R Rotation by 5 degrees to 65 degrees. 10/04/14    3. Pt will improve cervical segmental mobility to good for ease w/ ADLs. ??  Goal met.?? 10/07/14??  4. Pt will report > or = to 50% improvement in symptoms forease w/ work tasks.  Goal met. 50% reported.?? 10/07/14    PLAN    Upgrade activities as tolerated       Continue plan of care    Update interventions per flow sheet         Discharge due to:_    Other:_      Nancy Fetter 10/11/2014  11:37 AM

## 2014-10-14 ENCOUNTER — Inpatient Hospital Stay: Admit: 2014-10-14 | Payer: BLUE CROSS/BLUE SHIELD | Primary: Family Medicine

## 2014-10-14 NOTE — Progress Notes (Signed)
PT DAILY TREATMENT NOTE 8-14    Patient Name: Erika Duran  Date:10/14/2014  DOB: 1966-01-30    Patient DOB Verified  Payor: BLUE CROSS / Plan: [place] PPO / Product Type: PPO /    In time:1:30  Out time:2:21  Total Treatment Time (min): 51  Visit #: 12of 15    Treatment Area: Myofascial pain syndrome, cervical [M79.1]    SUBJECTIVE  Pain Level (0-10 scale): 1/10  Any medication changes, allergies to medications, adverse drug reactions, diagnosis change, or new procedure performed?:  No     Yes (see summary sheet for update)  Subjective functional status/changes:    No changes reported  Patient reported minimal neck pain.     OBJECTIVE    Modality rationale: decrease pain and increase tissue extensibility to improve the patient???s ability to perform ADLs   Min Type Additional Details     Estim: Att   Unatt        TENS instruct                  IFC  Premod   NMES                     Other:  w/US   w/ice   w/heat  Position:  Location:      Traction:  Cervical       Lumbar                        Prone          Supine                       Intermittent   Continuous Lbs:   before manual   after manual      Ultrasound: Continuous    Pulsed                           1MHz   3MHz Location:  W/cm2:      Iontophoresis with dexamethasone         Location:  Take home patch    In clinic   10   Ice       heat    Ice massage Position:  supine  Location: neck      Laser    Other: Position:  Location:      Vasopneumatic Device Pressure:        lo  med  hi   Temperature:  lo  med  hi    Skin assessment post-treatment:  intact redness- no adverse reaction    redness ??? adverse reaction:     33 min Therapeutic Exercise:   See flow sheet :   Rationale: increase ROM and increase strength to improve the patient???s ability to perform ADLs    8 min Manual Therapy:  SOR, STM to (B) UT/(L) cervical paraspinals/scm.    Rationale: decrease pain, increase ROM and increase tissue extensibility to increase ease with ADLs        With     TE    TA    neuro    other: Patient Education:  Review HEP     Progressed/Changed HEP based on:    positioning    body mechanics    transfers    heat/ice application     other:      Other Objective/Functional Measures:  Pain Level (0-10 scale) post treatment: 0/10    ASSESSMENT/Changes in Function: Patient reported no pain at end of session. Mild tightness noted at (B) UT.     Patient will continue to benefit from skilled PT services to modify and progress therapeutic interventions, address functional mobility deficits, address ROM deficits, address strength deficits, analyze and address soft tissue restrictions, analyze and cue movement patterns and assess and modify postural abnormalities to attain remaining goals.       See Plan of Care    See progress note/recertification    See Discharge Summary         Progress towards goals / Updated goals:  Short Term Goals: To be accomplished in 2?? weeks:  1. Pt will be independent and compliant w/ HEP to progress gains in PT. ??  MET. 09/16/14  2. Pt will report > or = to 25% improvement in ear symptoms for ease w/ ADLs. ??  Met, 30% reported.?? 09/24/14??     Long Term Goals: To be accomplished in 5?? weeks:  1. Pt will improve FOTO to > or = to 75 to demo improved function.  Regressed, FOTO 54 a decrease of 20. (09/30/14)??  2. Pt will increase cervical AROM into L SB and R rotation by > or = to 8-10 deg and no increased pain for ease w/ driving. ??Progressing: Improve L SB by 3 to 35 degrees and R Rotation by 5 degrees to 65 degrees. 10/04/14?? ??  3. Pt will improve cervical segmental mobility to good for ease w/ ADLs. ??  Goal met.?? 10/07/14??  4. Pt will report > or = to 50% improvement in symptoms forease w/ work tasks.  Goal met. 50% reported.?? 10/07/14    PLAN    Upgrade activities as tolerated       Continue plan of care    Update interventions per flow sheet         Discharge due to:_    Other:_      Angelica Ran, PT 10/14/2014  1:50 PM

## 2014-10-16 ENCOUNTER — Inpatient Hospital Stay: Admit: 2014-10-16 | Payer: BLUE CROSS/BLUE SHIELD | Primary: Family Medicine

## 2014-10-16 NOTE — Progress Notes (Signed)
PT DAILY TREATMENT NOTE 8-14    Patient Name: Erika Duran  Date:10/16/2014  DOB: 07-Apr-1966    Patient DOB Verified  Payor: BLUE CROSS / Plan: [place] PPO / Product Type: PPO /    In time:2:00  Out time: 2:47  Total Treatment Time (min): 31  Visit #: 13 of 15    Treatment Area: Myofascial pain syndrome, cervical [M79.1]    SUBJECTIVE  Pain Level (0-10 scale): 0/10  Any medication changes, allergies to medications, adverse drug reactions, diagnosis change, or new procedure performed?:  No     Yes (see summary sheet for update)  Subjective functional status/changes:    No changes reported  Pt denies pain.     OBJECTIVE    Modality rationale:    Min Type Additional Details     Estim: Att   Unatt        TENS instruct                  IFC  Premod   NMES                     Other:  w/US   w/ice   w/heat  Position:  Location:      Traction:  Cervical       Lumbar                        Prone          Supine                       Intermittent   Continuous Lbs:   before manual   after manual      Ultrasound: Continuous    Pulsed                           1MHz   3MHz Location:  W/cm2:      Iontophoresis with dexamethasone         Location:  Take home patch    In clinic      Ice       heat    Ice massage Position:  Location:      Laser    Other: Position:  Location:      Vasopneumatic Device Pressure:        lo  med  hi   Temperature:  lo  med  hi    Skin assessment post-treatment:  intact redness- no adverse reaction    redness ??? adverse reaction:     37 min Therapeutic Exercise:   See flow sheet :   Rationale: increase ROM and increase strength to improve the patient???s ability to perform ADLs.     10 min Manual Therapy:  Per flow sheet   Rationale: decrease pain, increase ROM, increase tissue extensibility and decrease trigger points to increase ease of ADLs.     Other Objective/Functional Measures: R rotation 65 degrees.       Pain Level (0-10 scale) post treatment: 0/10     ASSESSMENT/Changes in Function:  Cont per    Patient will continue to benefit from skilled PT services to address functional mobility deficits, address ROM deficits and address strength deficits to attain remaining goals.       See Plan of Care    See progress note/recertification    See Discharge Summary  Progress towards goals / Updated goals:  Short Term Goals: To be accomplished in 2?? weeks:  1. Pt will be independent and compliant w/ HEP to progress gains in PT. ??  MET. 09/16/14  2. Pt will report > or = to 25% improvement in ear symptoms for ease w/ ADLs. ??  Met, 30% reported.?? 09/24/14??     Long Term Goals: To be accomplished in 5?? weeks:  1. Pt will improve FOTO to > or = to 75 to demo improved function.  Regressed, FOTO 54 a decrease of 20. (09/30/14)??  2. Pt will increase cervical AROM into L SB and R rotation by > or = to 8-10 deg and no increased pain for ease w/ driving. ??  Progressing: Improve L SB by 3 to 35 degrees and R Rotation by 5 degrees to 65 degrees. 10/04/14?? ??  3. Pt will improve cervical segmental mobility to good for ease w/ ADLs. ??  Goal met.?? 10/07/14??  4. Pt will report > or = to 50% improvement in symptoms forease w/ work tasks.  Goal met. 50% reported.?? 10/07/14    PLAN    Upgrade activities as tolerated       Continue plan of care    Update interventions per flow sheet         Discharge due to:_    Other:_      Malaney Mcbean E Tahirah Sara, PTA 10/16/2014  1:58 PM

## 2014-10-18 ENCOUNTER — Inpatient Hospital Stay: Admit: 2014-10-18 | Payer: BLUE CROSS/BLUE SHIELD | Primary: Family Medicine

## 2014-10-18 NOTE — Progress Notes (Signed)
PT DAILY TREATMENT NOTE 8-14    Patient Name: Erika Duran  Date:10/18/2014  DOB: Nov 09, 1965    Patient DOB Verified  Payor: BLUE CROSS / Plan: [place] PPO / Product Type: PPO /    In time: 11:30  Out time:12:15  Total Treatment Time (min): 45  Visit #: 14 of 15    Treatment Area: Myofascial pain syndrome, cervical [M79.1]    SUBJECTIVE  Pain Level (0-10 scale): 0/10  Any medication changes, allergies to medications, adverse drug reactions, diagnosis change, or new procedure performed?:  No     Yes (see summary sheet for update)  Subjective functional status/changes:    No changes reported  Pt states she is feeling much better.     OBJECTIVE    Modality rationale:    Min Type Additional Details     Estim: Att   Unatt        TENS instruct                  IFC  Premod   NMES                     Other:  w/US   w/ice   w/heat  Position:  Location:      Traction:  Cervical       Lumbar                        Prone          Supine                       Intermittent   Continuous Lbs:   before manual   after manual      Ultrasound: Continuous    Pulsed                           1MHz   3MHz Location:  W/cm2:      Iontophoresis with dexamethasone         Location:  Take home patch    In clinic      Ice       heat    Ice massage Position:  Location:      Laser    Other: Position:  Location:      Vasopneumatic Device Pressure:        lo  med  hi   Temperature:  lo  med  hi    Skin assessment post-treatment:  intact redness- no adverse reaction    redness ??? adverse reaction:     35 min Therapeutic Exercise:   See flow sheet :   Rationale: increase ROM and increase strength to improve the patient???s ability to perform ADLs.     10 min Manual Therapy:  Per flow sheet   Rationale: decrease pain, increase ROM and increase tissue extensibility to increase ease of ADLs.     Other Objective/Functional Measures:  See goals below.      Pain Level (0-10 scale) post treatment: 0/10     ASSESSMENT/Changes in Function: Pt has met 3 of 4 LTGs. Pt reports 75% improvement since IE.  FOTO has increase to 79.  AROM as follows: L SB 35, R rot 65 degrees.  Cervical segmental mobility has improved.         See Plan of Care    See progress note/recertification    See  Discharge Summary         Progress towards goals / Updated goals:  Short Term Goals: To be accomplished in 2?? weeks:  1. Pt will be independent and compliant w/ HEP to progress gains in PT. ??  MET. 09/16/14  2. Pt will report > or = to 25% improvement in ear symptoms for ease w/ ADLs. ??  Met, 30% reported.?? 09/24/14??     Long Term Goals: To be accomplished in 5?? weeks:  1. Pt will improve FOTO to > or = to 75 to demo improved function.  Goal met, FOTO 79 an increase of 5. (10/18/14)??  2. Pt will increase cervical AROM into L SB and R rotation by > or = to 8-10 deg and no increased pain for ease w/ driving. ??  Progressing: Improve L SB by 3 to 35 degrees and R Rotation by 5 degrees to 65 degrees. 10/04/14?? ??  3. Pt will improve cervical segmental mobility to good for ease w/ ADLs. ??  Goal met.?? 10/07/14??  4. Pt will report > or = to 50% improvement in symptoms forease w/ work tasks.  Goal met. 75% reported.?? 10/18/14    PLAN    Upgrade activities as tolerated       Continue plan of care    Update interventions per flow sheet         Discharge due to: Most goals met.     Other:_      Melanye Hiraldo E Deo Mehringer, PTA 10/18/2014  12:45 PM

## 2014-10-21 NOTE — Progress Notes (Signed)
[facility] ??? [facility]              [address] [place] [address]  [phone]   [phone] fax    Discharge Summary  Patient name: Rani Idler Start of Care: 09/11/14   Referral source: Vassie Loll, MD DOB: Jul 09, 1966   Medical/Treatment Diagnosis: Myofascial pain syndrome, cervical [M79.1] Onset Date:1 year ago     Prior Hospitalization: see medical history Provider#: [id number]   Medications: Verified on Patient Summary List    Comorbidities: allergies, headaches  ??Prior Level of Function: Hx HA and ear infections  Visits from Start of Care: 14    Missed Visits: 1  Reporting Period : 09/11/14 to 10/18/14    Summary of Care:  Progress towards goals / Updated goals:  Short Term Goals: To be accomplished in 2?? weeks:  1. Pt will be independent and compliant w/ HEP to progress gains in PT. ??MET. 09/16/14  2. Pt will report > or = to 25% improvement in ear symptoms for ease w/ ADLs. ??Met, 30% reported.?? 09/24/14??   Long Term Goals: To be accomplished in 5?? weeks:  1. Pt will improve FOTO to > or = to 75 to demo improved function. Goal met, FOTO 79 an increase of 5. (10/18/14)??  2. Pt will increase cervical AROM into L SB and R rotation by > or = to 8-10 deg and no increased pain for ease w/ driving. ??Progressing: Improve L SB by 3 to 35 degrees and R Rotation by 5 degrees to 65 degrees. 10/04/14?? ??  3. Pt will improve cervical segmental mobility to good for ease w/ ADLs. ??Goal met.?? 10/07/14??  4. Pt will report > or = to 50% improvement in symptoms forease w/ work tasks. Goal met. 75% reported.?? 10/18/14    Pt has progressed well w/ PT and has met 3 of 4 LTGs. Pt reports 75% improvement since IE.?? FOTO has increase to 79.?? AROM as follows: L SB 35, R rot 65 degrees.?? Cervical segmental mobility has improved.?? Has HEP and is appropriate for DC to HEP at this time.     ASSESSMENT/RECOMMENDATIONS:  Discontinue therapy: Patient has reached or is progressing toward set goals       Patient is non-compliant or has abdicated      Due to lack of appreciable progress towards set goals    Barrie Dunker, PT 10/21/2014 2:43 PM

## 2018-02-15 NOTE — Progress Notes (Signed)
Consult Note: Gyn-Onc  Consult was requested by Dr. Nehemiah Settle with [facility] gastroenterology  CC:  Chief Complaint  Patient presents with  . Mass    abdomino-pelvic    HPI: [name]  is a very nice 52 y.o. P2  In March she noted some abdominal pain and increasing abdominal girth.  She is uninsured and apparently was seen by a [place] then referred on to gastroenterology in [facility].  The gastroenterologist proceeded with ultrasound imaging of her abdomen given the significant swelling on exam.  Abdominal ultrasound performed 02/10/2018 was significant for findings of an at least 30 cm cystic and solid mass in the midline lower abdomen incompletely imaged.  The patient was therefore referred for recommendations.  The patient notes pain is more to her right rated between 5-10 medications do help.  She does notice early satiety however she has a good appetite.  She has had 2 episodes of emesis she thinks related to items that she ate.  She has a history of constipation and is having bowel movements although that is starting to go to every 2 to 3 days at this point.  We reviewed her penicillin allergy she states that it about 2 to 3 days after taking penicillin she noted a rash.  This occurred approximately 20 years ago she thinks it was amoxicillin.  She required no hospitalization she had no throat swelling no breathing issues.  Measurement of disease:  Ca1 25 and CEA will be ordered . _________________  Radiology: . 02/10/2018 abdominal ultrasound-[place] radiology-complex cystic and solid mass 30 cm greatest dimension.  I did personally review these images and this is a very complex lesion with multiple septations suspicious for a borderline tumor.  I do not see any significant solid components in the images I was able to review  Current Meds:  Outpatient Encounter Medications as of 02/17/2018  Medication Sig  . aspirin EC 81 MG tablet Take 81 mg  by mouth daily.  . traMADol (ULTRAM) 50 MG tablet TK 1 TO 2 TS PO UP TO QID FOR ABDOMINAL PAIN   No facility-administered encounter medications on file as of 02/17/2018.     Allergy:  Allergies  Allergen Reactions  . Eggs Or Egg-Derived Products Other (See Comments)    Bumps and upset stomach  . Penicillins Itching    Social Hx:  Tobacco use: None Alcohol use: None Illicit Drug use: None Illicit IV Drug use: None  Past Surgical Hx:  Past Surgical History:  Procedure Laterality Date  . CESAREAN SECTION     x 2  . HAND DEBRIDEMENT Left 09/2011   infected after splinter with MRSA  . TRIGGER FINGER RELEASE Right 2015  . TUBAL LIGATION  2005    Past Medical Hx:  Past Medical History:  Diagnosis Date  . Arthritis   . Constipation   . MRSA (methicillin resistant staph aureus) culture positive   . Myofascial pain syndrome    Myofascial pain disorder    Past Gynecological History:   GYNECOLOGIC HISTORY:  No LMP recorded. Menarche: 52 years old P 9 LMP 52 years old in 2011 Contraceptive greater than 10 years birth control pills HRT none  Last Pap December 2015 therefore we did do one today on her first visit June 2019  Family Hx:  Family History  Problem Relation Age of Onset  . Breast cancer Mother 45  . Breast cancer Sister 36  . Colon cancer Maternal Uncle   . Cancer Maternal  Grandmother     Review of Systems:  Review of Systems  Constitutional: Positive for appetite change and fatigue.  Gastrointestinal: Positive for abdominal distention, abdominal pain, constipation, nausea and vomiting.  Musculoskeletal: Positive for back pain.  All other systems reviewed and are negative.   Vitals:  Blood pressure (!) 134/56, pulse 99, temperature 98.4 F (36.9 C), temperature source Oral, resp. rate 20, height 5\' 3"  (1.6 m), weight 154 lb 4.8 oz (70 kg), SpO2 100 %. Body mass index is 27.33 kg/m.   Physical Exam: ECOG PERFORMANCE STATUS: 1 - Symptomatic but  completely ambulatory   General :  Well developed, 52 y.o., female in no apparent distress HEENT:  Normocephalic/atraumatic, symmetric, EOMI, eyelids normal Neck:   Supple, no masses.  Lymphatics:  No cervical/ submandibular/ supraclavicular/ infraclavicular/ inguinal adenopathy Respiratory:  Respirations unlabored, no use of accessory muscles CV:   Deferred Breast:  Deferred Musculoskeletal: No CVA tenderness, normal muscle strength. Abdomen:  Distended with obvious mass, soft, non-tender. No evidence of hernia.  Extremities:  No lymphedema, no erythema, non-tender. Skin:   Normal inspection Neuro/Psych:  No focal motor deficit, no abnormal mental status. Normal gait. Normal affect. Alert and oriented to person, place, and time  Genito Urinary: Vulva: Normal external female genitalia.  Bladder/urethra: Urethral meatus normal in size and location. No lesions or   masses, well supported bladder Speculum exam: Vagina: No lesion, no discharge, no bleeding. Cervix: Difficult to visualize feels like it being pulled anteriorly and its pointing posteriorly she is nulliparous in the office is difficult to visualize otherwise normal appearing, no lesions. Bimanual exam:  Uterus: Difficult to delineate given significant distention   Adnexa: Large mass contiguous with the abdominal mass unable to differentiate based on exam however there does not appear to be nodularity in the cul-de-sac Rectovaginal:  Good tone, no masses in the rectal canal as far as I can reach, no cul de sac nodularity, no parametrial involvement or nodularity.   Assessment/Plan: 1. Pelvic mass ? Further imaging with CT needs to be performed to rule out metastatic disease  ? tumor markers CEA and Ca1 25 will be performed ? I do not recommend observation given the large size and worrisome features on the imaging that I reviewed ? Factors reviewed supporting surgical management include the large size, patient symptoms, and the  complex nature of the lesion  2. Management ? Given the large size I have recommended an open approach 3. Preoperative items will include  ?  Pap smear ? CT imaging 4. Surgical discussion ? We agreed on a plan for open BSO, possible TAH and staging ? We discussed if malignancy or borderline tumor is suspected at the time of surgery that complete hysterectomy, BSO, and staging would be performed.  ? Surgical sketch was reviewed including the risk, benefits, alternatives of the procedure ? She was here by herself today by and her questions were answered to satisfaction. ? The patient was given a copy of the surgical sketch the end of the visit. 5. Return to clinic 10 to 14 days postop to review pathology and evaluate incisions 6. She is to stop the aspirin.    [name], MD  02/17/2018, 5:33 PM  Cc: Nehemiah Settle, MD (Referring gastroenterologist)

## 2018-02-15 NOTE — H&P (View-Only) (Signed)
Consult Note: Gyn-Onc  Consult was requested by Dr. Nehemiah Settle with [facility] gastroenterology  CC:  Chief Complaint  Patient presents with  . Mass    abdomino-pelvic    HPI: Teresa Richardson  is a very nice 52 y.o. P2  In March she noted some abdominal pain and increasing abdominal girth.  She is uninsured and apparently was seen by a [place] then referred on to gastroenterology in [facility].  The gastroenterologist proceeded with ultrasound imaging of her abdomen given the significant swelling on exam.  Abdominal ultrasound performed 02/10/2018 was significant for findings of an at least 30 cm cystic and solid mass in the midline lower abdomen incompletely imaged.  The patient was therefore referred for recommendations.  The patient notes pain is more to her right rated between 5-10 medications do help.  She does notice early satiety however she has a good appetite.  She has had 2 episodes of emesis she thinks related to items that she ate.  She has a history of constipation and is having bowel movements although that is starting to go to every 2 to 3 days at this point.  We reviewed her penicillin allergy she states that it about 2 to 3 days after taking penicillin she noted a rash.  This occurred approximately 20 years ago she thinks it was amoxicillin.  She required no hospitalization she had no throat swelling no breathing issues.  Measurement of disease:  Ca1 25 and CEA will be ordered . _________________  Radiology: . 02/10/2018 abdominal ultrasound-[place] radiology-complex cystic and solid mass 30 cm greatest dimension.  I did personally review these images and this is a very complex lesion with multiple septations suspicious for a borderline tumor.  I do not see any significant solid components in the images I was able to review  Current Meds:  Outpatient Encounter Medications as of 02/17/2018  Medication Sig  . aspirin EC 81 MG tablet Take 81 mg  by mouth daily.  . traMADol (ULTRAM) 50 MG tablet TK 1 TO 2 TS PO UP TO QID FOR ABDOMINAL PAIN   No facility-administered encounter medications on file as of 02/17/2018.     Allergy:  Allergies  Allergen Reactions  . Eggs Or Egg-Derived Products Other (See Comments)    Bumps and upset stomach  . Penicillins Itching    Social Hx:  Tobacco use: None Alcohol use: None Illicit Drug use: None Illicit IV Drug use: None  Past Surgical Hx:  Past Surgical History:  Procedure Laterality Date  . CESAREAN SECTION     x 2  . HAND DEBRIDEMENT Left 09/2011   infected after splinter with MRSA  . TRIGGER FINGER RELEASE Right 2015  . TUBAL LIGATION  2005    Past Medical Hx:  Past Medical History:  Diagnosis Date  . Arthritis   . Constipation   . MRSA (methicillin resistant staph aureus) culture positive   . Myofascial pain syndrome    Myofascial pain disorder    Past Gynecological History:   GYNECOLOGIC HISTORY:  No LMP recorded. Menarche: 52 years old P 19 LMP 52 years old in 2011 Contraceptive greater than 10 years birth control pills HRT none  Last Pap December 2015 therefore we did do one today on her first visit June 2019  Family Hx:  Family History  Problem Relation Age of Onset  . Breast cancer Mother 32  . Breast cancer Sister 69  . Colon cancer Maternal Uncle   . Cancer Maternal  Grandmother     Review of Systems:  Review of Systems  Constitutional: Positive for appetite change and fatigue.  Gastrointestinal: Positive for abdominal distention, abdominal pain, constipation, nausea and vomiting.  Musculoskeletal: Positive for back pain.  All other systems reviewed and are negative.   Vitals:  Blood pressure (!) 134/56, pulse 99, temperature 98.4 F (36.9 C), temperature source Oral, resp. rate 20, height 5\' 3"  (1.6 m), weight 154 lb 4.8 oz (70 kg), SpO2 100 %. Body mass index is 27.33 kg/m.   Physical Exam: ECOG PERFORMANCE STATUS: 1 - Symptomatic but  completely ambulatory   General :  Well developed, 52 y.o., female in no apparent distress HEENT:  Normocephalic/atraumatic, symmetric, EOMI, eyelids normal Neck:   Supple, no masses.  Lymphatics:  No cervical/ submandibular/ supraclavicular/ infraclavicular/ inguinal adenopathy Respiratory:  Respirations unlabored, no use of accessory muscles CV:   Deferred Breast:  Deferred Musculoskeletal: No CVA tenderness, normal muscle strength. Abdomen:  Distended with obvious mass, soft, non-tender. No evidence of hernia.  Extremities:  No lymphedema, no erythema, non-tender. Skin:   Normal inspection Neuro/Psych:  No focal motor deficit, no abnormal mental status. Normal gait. Normal affect. Alert and oriented to person, place, and time  Genito Urinary: Vulva: Normal external female genitalia.  Bladder/urethra: Urethral meatus normal in size and location. No lesions or   masses, well supported bladder Speculum exam: Vagina: No lesion, no discharge, no bleeding. Cervix: Difficult to visualize feels like it being pulled anteriorly and its pointing posteriorly she is nulliparous in the office is difficult to visualize otherwise normal appearing, no lesions. Bimanual exam:  Uterus: Difficult to delineate given significant distention   Adnexa: Large mass contiguous with the abdominal mass unable to differentiate based on exam however there does not appear to be nodularity in the cul-de-sac Rectovaginal:  Good tone, no masses in the rectal canal as far as I can reach, no cul de sac nodularity, no parametrial involvement or nodularity.   Assessment/Plan: 1. Pelvic mass ? Further imaging with CT needs to be performed to rule out metastatic disease  ? tumor markers CEA and Ca1 25 will be performed ? I do not recommend observation given the large size and worrisome features on the imaging that I reviewed ? Factors reviewed supporting surgical management include the large size, patient symptoms, and the  complex nature of the lesion  2. Management ? Given the large size I have recommended an open approach 3. Preoperative items will include  ?  Pap smear ? CT imaging 4. Surgical discussion ? We agreed on a plan for open BSO, possible TAH and staging ? We discussed if malignancy or borderline tumor is suspected at the time of surgery that complete hysterectomy, BSO, and staging would be performed.  ? Surgical sketch was reviewed including the risk, benefits, alternatives of the procedure ? She was here by herself today by and her questions were answered to satisfaction. ? The patient was given a copy of the surgical sketch the end of the visit. 5. Return to clinic 10 to 14 days postop to review pathology and evaluate incisions 6. She is to stop the aspirin.    [name], MD  02/17/2018, 5:33 PM  Cc: Nehemiah Settle, MD (Referring gastroenterologist)

## 2018-02-17 ENCOUNTER — Inpatient Hospital Stay: Payer: Medicaid Other | Attending: Obstetrics | Admitting: Obstetrics

## 2018-02-17 ENCOUNTER — Inpatient Hospital Stay: Payer: Medicaid Other

## 2018-02-17 ENCOUNTER — Encounter: Payer: Self-pay | Admitting: *Deleted

## 2018-02-17 ENCOUNTER — Telehealth: Payer: Self-pay | Admitting: Oncology

## 2018-02-17 ENCOUNTER — Other Ambulatory Visit (HOSPITAL_COMMUNITY)
Admission: RE | Admit: 2018-02-17 | Discharge: 2018-02-17 | Disposition: A | Discharge status: Home / Self Care | Payer: Self-pay | Source: Ambulatory Visit | Attending: Obstetrics | Admitting: Obstetrics

## 2018-02-17 VITALS — BP 134/56 | HR 99 | Temp 98.4°F | Resp 20 | Ht 63.0 in | Wt 154.3 lb

## 2018-02-17 DIAGNOSIS — R1900 Intra-abdominal and pelvic swelling, mass and lump, unspecified site: Secondary | ICD-10-CM

## 2018-02-17 LAB — COMPREHENSIVE METABOLIC PANEL
ALBUMIN: 3.7 g/dL (ref 3.5–5.0)
ALK PHOS: 172 U/L — AB (ref 40–150)
ALT: 44 U/L (ref 0–55)
AST: 41 U/L — AB (ref 5–34)
Anion gap: 14 — ABNORMAL HIGH (ref 3–11)
BILIRUBIN TOTAL: 0.3 mg/dL (ref 0.2–1.2)
BUN: 9 mg/dL (ref 7–26)
CALCIUM: 10.3 mg/dL (ref 8.4–10.4)
CO2: 23 mmol/L (ref 22–29)
Chloride: 98 mmol/L (ref 98–109)
Creatinine, Ser: 0.7 mg/dL (ref 0.60–1.10)
GFR calc Af Amer: >60 mL/min (ref >60)
GFR calc non Af Amer: >60 mL/min (ref >60)
Glucose, Bld: 98 mg/dL (ref 70–140)
Potassium: 3.8 mmol/L (ref 3.5–5.1)
SODIUM: 135 mmol/L — AB (ref 136–145)
TOTAL PROTEIN: 8.4 g/dL — AB (ref 6.4–8.3)

## 2018-02-17 LAB — CEA (IN HOUSE-CHCC): CEA (CHCC-In House): 177.29 ng/mL — ABNORMAL HIGH (ref 0.00–5.00)

## 2018-02-17 NOTE — Telephone Encounter (Signed)
[place], Finacial Specialist to see if she can meet with patient today regarding financial assistance.  Requested a return call.

## 2018-02-17 NOTE — Patient Instructions (Addendum)
Plan on having a CT scan of the abdomen and pelvis to further evaluate the mass seen in your abdomen/pelvis.                Preparing for your Surgery  Plan for surgery on February 28, 2018 with Dr. Precious Haws at [place] will be scheduled for an exploratory laparotomy, bilateral salpingo-oophorectomy, possible total abdominal hysterectomy, possible staging.  STOP TAKING ASPIRIN NOW  Pre-operative Testing -You will receive a phone call from presurgical testing at [facility] to arrange for a pre-operative testing appointment before your surgery.  This appointment normally occurs one to two weeks before your scheduled surgery.   -Bring your insurance card, copy of an advanced directive if applicable, medication list  -At that visit, you will be asked to sign a consent for a possible blood transfusion in case a transfusion becomes necessary during surgery.  The need for a blood transfusion is rare but having consent is a necessary part of your care.     -You should not be taking blood thinners or aspirin at least ten days prior to surgery unless instructed by your surgeon.  Day Before Surgery at [place] will be asked to take in a light diet the day before surgery.  Avoid carbonated beverages.  You will be advised to have nothing to eat or drink after midnight the evening before.    Eat a light diet the day before surgery.  Examples including soups, broths, toast, yogurt, mashed potatoes.  Things to avoid include carbonated beverages (fizzy beverages), raw fruits and raw vegetables, or beans.   If your bowels are filled with gas, your surgeon will have difficulty visualizing your pelvic organs which increases your surgical risks.  Your role in recovery Your role is to become active as soon as directed by your doctor, while still giving yourself time to heal.  Rest when you feel tired. You will be asked to do the following in order to speed your recovery:  - Cough  and breathe deeply. This helps toclear and expand your lungs and can prevent pneumonia. You may be given a spirometer to practice deep breathing. A staff member will show you how to use the spirometer. - Do mild physical activity. Walking or moving your legs help your circulation and body functions return to normal. A staff member will help you when you try to walk and will provide you with simple exercises. Do not try to get up or walk alone the first time. - Actively manage your pain. Managing your pain lets you move in comfort. We will ask you to rate your pain on a scale of zero to 10. It is your responsibility to tell your doctor or nurse where and how much you hurt so your pain can be treated.  Special Considerations -If you are diabetic, you may be placed on insulin after surgery to have closer control over your blood sugars to promote healing and recovery.  This does not mean that you will be discharged on insulin.  If applicable, your oral antidiabetics will be resumed when you are tolerating a solid diet.  -Your final pathology results from surgery should be available by the Friday after surgery and the results will be relayed to you when available.  -Dr. Lahoma Crocker is the Surgeon that assists your GYN Oncologist with surgery.  The next day after your surgery you will either see your GYN Oncologist or Dr. Lahoma Crocker.   Blood Transfusion  Information WHAT IS A BLOOD TRANSFUSION? A transfusion is the replacement of blood or some of its parts. Blood is made up of multiple cells which provide different functions.  Red blood cells carry oxygen and are used for blood loss replacement.  White blood cells fight against infection.  Platelets control bleeding.  Plasma helps clot blood.  Other blood products are available for specialized needs, such as hemophilia or other clotting disorders. BEFORE THE TRANSFUSION  Who gives blood for transfusions?   You may be able to  donate blood to be used at a later date on yourself (autologous donation).  Relatives can be asked to donate blood. This is generally not any safer than if you have received blood from a stranger. The same precautions are taken to ensure safety when a relative's blood is donated.  Healthy volunteers who are fully evaluated to make sure their blood is safe. This is blood bank blood. Transfusion therapy is the safest it has ever been in the practice of medicine. Before blood is taken from a donor, a complete history is taken to make sure that person has no history of diseases nor engages in risky social behavior (examples are intravenous drug use or sexual activity with multiple partners). The donor's travel history is screened to minimize risk of transmitting infections, such as malaria. The donated blood is tested for signs of infectious diseases, such as HIV and hepatitis. The blood is then tested to be sure it is compatible with you in order to minimize the chance of a transfusion reaction. If you or a relative donates blood, this is often done in anticipation of surgery and is not appropriate for emergency situations. It takes many days to process the donated blood. RISKS AND COMPLICATIONS Although transfusion therapy is very safe and saves many lives, the main dangers of transfusion include:   Getting an infectious disease.  Developing a transfusion reaction. This is an allergic reaction to something in the blood you were given. Every precaution is taken to prevent this. The decision to have a blood transfusion has been considered carefully by your caregiver before blood is given. Blood is not given unless the benefits outweigh the risks.

## 2018-02-18 LAB — CA 125: CANCER ANTIGEN (CA) 125: 101.8 U/mL — AB (ref 0.0–38.1)

## 2018-02-21 ENCOUNTER — Encounter (HOSPITAL_COMMUNITY): Payer: Self-pay

## 2018-02-21 ENCOUNTER — Ambulatory Visit (HOSPITAL_COMMUNITY)
Admission: RE | Admit: 2018-02-21 | Discharge: 2018-02-21 | Disposition: A | Discharge status: Home / Self Care | Payer: Medicaid Other | Source: Ambulatory Visit | Attending: Obstetrics | Admitting: Obstetrics

## 2018-02-21 DIAGNOSIS — R1900 Intra-abdominal and pelvic swelling, mass and lump, unspecified site: Secondary | ICD-10-CM | POA: Insufficient documentation

## 2018-02-21 DIAGNOSIS — K7689 Other specified diseases of liver: Secondary | ICD-10-CM | POA: Insufficient documentation

## 2018-02-21 MED ORDER — IOPAMIDOL (ISOVUE-370) INJECTION 76%
INTRAVENOUS | Status: AC
  Filled 2018-02-21: qty 100

## 2018-02-21 MED ORDER — IOPAMIDOL (ISOVUE-300) INJECTION 61%
100.0000 mL | Freq: Once | INTRAVENOUS | Status: AC | PRN
  Administered 2018-02-21: 100 mL via INTRAVENOUS

## 2018-02-22 LAB — CYTOLOGY - PAP: Diagnosis: NEGATIVE

## 2018-02-22 NOTE — Patient Instructions (Addendum)
Teresa Richardson  02/22/2018   Your procedure is scheduled on: 02-28-18   Report to [facility] Main  Entrance   Report to Admitting at 5:30 AM    Call this number if you have problems the morning of surgery [phone]    Remember: NO SOLID FOOD AFTER MIDNIGHT THE NIGHT PRIOR TO SURGERY. NOTHING BY MOUTH EXCEPT CLEAR LIQUIDS UNTIL 3 HOURS PRIOR TO [place] SURGERY. PLEASE FINISH ENSURE DRINK PER SURGEON ORDER 3 HOURS PRIOR TO SCHEDULED SURGERY TIME WHICH NEEDS TO BE COMPLETED AT 4:30 ____________.    CLEAR LIQUID DIET   Foods Allowed                                                                     Foods Excluded  Coffee and tea, regular and decaf                             liquids that you cannot  Plain Jell-O in any flavor                                             see through such as: Fruit ices (not with fruit pulp)                                     milk, soups, orange juice  Iced Popsicles                                    All solid food Carbonated beverages, regular and diet                                    Cranberry, grape and apple juices Sports drinks like Gatorade Lightly seasoned clear broth or consume(fat free) Sugar, honey syrup  Sample Menu Breakfast                                Lunch                                     Supper Cranberry juice                    Beef broth                            Chicken broth Jell-O                                     Grape juice  Apple juice Coffee or tea                        Jell-O                                      Popsicle                                                Coffee or tea                        Coffee or tea  _____________________________________________________________________     Eat a light diet the day before surgery.  Examples including soups, broths, toast, yogurt, mashed potatoes.  Things to avoid include carbonated beverages (fizzy  beverages), raw fruits and raw vegetables, or beans.   If your bowels are filled with gas, your surgeon will have difficulty visualizing your pelvic organs which increases your surgical risks.   Take these medicines the morning of surgery with A SIP OF WATER: None                                You may not have any metal on your body including hair pins and              piercings  Do not wear jewelry, make-up, lotions, powders or perfumes, deodorant             Do not wear nail polish.  Do not shave  48 hours prior to surgery.     Do not bring valuables to the hospital. [place].  Contacts, dentures or bridgework may not be worn into surgery.  Leave suitcase in the car. After surgery it may be brought to your room.      Special Instructions:Cough and Deep Breath Exercises              Please read over the following fact sheets you were given: _____________________________________________________________________             [facility] - Preparing for Surgery Before surgery, you can play an important role.  Because skin is not sterile, your skin needs to be as free of germs as possible.  You can reduce the number of germs on your skin by washing with CHG (chlorahexidine gluconate) soap before surgery.  CHG is an antiseptic cleaner which kills germs and bonds with the skin to continue killing germs even after washing. Please DO NOT use if you have an allergy to CHG or antibacterial soaps.  If your skin becomes reddened/irritated stop using the CHG and inform your nurse when you arrive at Short Stay. Do not shave (including legs and underarms) for at least 48 hours prior to the first CHG shower.  You may shave your face/neck. Please follow these instructions carefully:  1.  Shower with CHG Soap the night before surgery and the  morning of Surgery.  2.  If you choose to wash your hair, wash your hair first as usual with your  normal   shampoo.  3.  After you shampoo, rinse your hair and body thoroughly to remove the  shampoo.                           4.  Use CHG as you would any other liquid soap.  You can apply chg directly  to the skin and wash                       Gently with a scrungie or clean washcloth.  5.  Apply the CHG Soap to your body ONLY FROM THE NECK DOWN.   Do not use on face/ open                           Wound or open sores. Avoid contact with eyes, ears mouth and genitals (private parts).                       Wash face,  Genitals (private parts) with your normal soap.             6.  Wash thoroughly, paying special attention to the area where your surgery  will be performed.  7.  Thoroughly rinse your body with warm water from the neck down.  8.  DO NOT shower/wash with your normal soap after using and rinsing off  the CHG Soap.                9.  Pat yourself dry with a clean towel.            10.  Wear clean pajamas.            11.  Place clean sheets on your bed the night of your first shower and do not  sleep with pets. Day of Surgery : Do not apply any lotions/deodorants the morning of surgery.  Please wear clean clothes to the hospital/surgery center.  FAILURE TO FOLLOW THESE INSTRUCTIONS MAY RESULT IN THE CANCELLATION OF YOUR SURGERY PATIENT SIGNATURE_________________________________  NURSE SIGNATURE__________________________________  ________________________________________________________________________   Adam Phenix  An incentive spirometer is a tool that can help keep your lungs clear and active. This tool measures how well you are filling your lungs with each breath. Taking long deep breaths may help reverse or decrease the chance of developing breathing (pulmonary) problems (especially infection) following:  A long period of time when you are unable to move or be active. BEFORE THE PROCEDURE   If the spirometer includes an indicator to show your best effort, your nurse or  respiratory therapist will set it to a desired goal.  If possible, sit up straight or lean slightly forward. Try not to slouch.  Hold the incentive spirometer in an upright position. INSTRUCTIONS FOR USE  1. Sit on the edge of your bed if possible, or sit up as far as you can in bed or on a chair. 2. Hold the incentive spirometer in an upright position. 3. Breathe out normally. 4. Place the mouthpiece in your mouth and seal your lips tightly around it. 5. Breathe in slowly and as deeply as possible, raising the piston or the ball toward the top of the column. 6. Hold your breath for 3-5 seconds or for as long as possible. Allow the piston or ball to fall to the bottom of the column. 7. Remove the mouthpiece from your mouth and breathe out normally. 8. Rest for a  few seconds and repeat Steps 1 through 7 at least 10 times every 1-2 hours when you are awake. Take your time and take a few normal breaths between deep breaths. 9. The spirometer may include an indicator to show your best effort. Use the indicator as a goal to work toward during each repetition. 10. After each set of 10 deep breaths, practice coughing to be sure your lungs are clear. If you have an incision (the cut made at the time of surgery), support your incision when coughing by placing a pillow or rolled up towels firmly against it. Once you are able to get out of bed, walk around indoors and cough well. You may stop using the incentive spirometer when instructed by your caregiver.  RISKS AND COMPLICATIONS  Take your time so you do not get dizzy or light-headed.  If you are in pain, you may need to take or ask for pain medication before doing incentive spirometry. It is harder to take a deep breath if you are having pain. AFTER USE  Rest and breathe slowly and easily.  It can be helpful to keep track of a log of your progress. Your caregiver can provide you with a simple table to help with this. If you are using the  spirometer at home, follow these instructions: [place] IF:   You are having difficultly using the spirometer.  You have trouble using the spirometer as often as instructed.  Your pain medication is not giving enough relief while using the spirometer.  You develop fever of 100.5 F (38.1 C) or higher. SEEK IMMEDIATE MEDICAL CARE IF:   You cough up bloody sputum that had not been present before.  You develop fever of 102 F (38.9 C) or greater.  You develop worsening pain at or near the incision site. MAKE SURE YOU:   Understand these instructions.  Will watch your condition.  Will get help right away if you are not doing well or get worse. Document Released: 01/03/2007 Document Revised: 11/15/2011 Document Reviewed: 03/06/2007 Exit[employer] Patient Information 2014 Exit[employer], [place].   ________________________________________________________________________  WHAT IS A BLOOD TRANSFUSION? Blood Transfusion Information  A transfusion is the replacement of blood or some of its parts. Blood is made up of multiple cells which provide different functions.  Red blood cells carry oxygen and are used for blood loss replacement.  White blood cells fight against infection.  Platelets control bleeding.  Plasma helps clot blood.  Other blood products are available for specialized needs, such as hemophilia or other clotting disorders. BEFORE THE TRANSFUSION  Who gives blood for transfusions?   Healthy volunteers who are fully evaluated to make sure their blood is safe. This is blood bank blood. Transfusion therapy is the safest it has ever been in the practice of medicine. Before blood is taken from a donor, a complete history is taken to make sure that person has no history of diseases nor engages in risky social behavior (examples are intravenous drug use or sexual activity with multiple partners). The donor's travel history is screened to minimize risk of transmitting  infections, such as malaria. The donated blood is tested for signs of infectious diseases, such as HIV and hepatitis. The blood is then tested to be sure it is compatible with you in order to minimize the chance of a transfusion reaction. If you or a relative donates blood, this is often done in anticipation of surgery and is not appropriate for emergency situations. It takes many days to  process the donated blood. RISKS AND COMPLICATIONS Although transfusion therapy is very safe and saves many lives, the main dangers of transfusion include:   Getting an infectious disease.  Developing a transfusion reaction. This is an allergic reaction to something in the blood you were given. Every precaution is taken to prevent this. The decision to have a blood transfusion has been considered carefully by your caregiver before blood is given. Blood is not given unless the benefits outweigh the risks. AFTER THE TRANSFUSION  Right after receiving a blood transfusion, you will usually feel much better and more energetic. This is especially true if your red blood cells have gotten low (anemic). The transfusion raises the level of the red blood cells which carry oxygen, and this usually causes an energy increase.  The nurse administering the transfusion will monitor you carefully for complications. HOME CARE INSTRUCTIONS  No special instructions are needed after a transfusion. You may find your energy is better. Speak with your caregiver about any limitations on activity for underlying diseases you may have. SEEK MEDICAL CARE IF:   Your condition is not improving after your transfusion.  You develop redness or irritation at the intravenous (IV) site. SEEK IMMEDIATE MEDICAL CARE IF:  Any of the following symptoms occur over the next 12 hours:  Shaking chills.  You have a temperature by mouth above 102 F (38.9 C), not controlled by medicine.  Chest, back, or muscle pain.  People around you feel you are  not acting correctly or are confused.  Shortness of breath or difficulty breathing.  Dizziness and fainting.  You get a rash or develop hives.  You have a decrease in urine output.  Your urine turns a dark color or changes to pink, red, or brown. Any of the following symptoms occur over the next 10 days:  You have a temperature by mouth above 102 F (38.9 C), not controlled by medicine.  Shortness of breath.  Weakness after normal activity.  The white part of the eye turns yellow (jaundice).  You have a decrease in the amount of urine or are urinating less often.  Your urine turns a dark color or changes to pink, red, or brown. Document Released: 08/20/2000 Document Revised: 11/15/2011 Document Reviewed: 04/08/2008 [facility] Patient Information 2014 [place], [place].  _______________________________________________________________________

## 2018-02-23 ENCOUNTER — Telehealth: Payer: Self-pay

## 2018-02-23 NOTE — Telephone Encounter (Signed)
Returned pt's call regarding 6/18 recent CT abd/pelvis results. 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  No other needs per pt at this time and she is aware of upcoming scheduled appts.

## 2018-02-23 NOTE — Telephone Encounter (Signed)
Outgoing call to patient per Teresa John NP regarding recent Pap results "normal" Teresa Richardson voiced understanding and asked about her recent CT, if results back.  Per Lenna Sciara NP - she has printed the results from CT and Dr Gerarda Fraction will review later today and then we will call her back. Teresa Richardson voiced understanding. No other needs per Teresa Richardson at this time.

## 2018-02-24 ENCOUNTER — Encounter (HOSPITAL_COMMUNITY)
Admission: RE | Admit: 2018-02-24 | Discharge: 2018-02-24 | Disposition: A | Discharge status: Home / Self Care | Payer: Medicaid Other | Source: Ambulatory Visit | Attending: Obstetrics | Admitting: Obstetrics

## 2018-02-24 ENCOUNTER — Ambulatory Visit (HOSPITAL_COMMUNITY)
Admission: RE | Admit: 2018-02-24 | Discharge: 2018-02-24 | Disposition: A | Discharge status: Home / Self Care | Payer: Medicaid Other | Source: Ambulatory Visit | Attending: Gynecologic Oncology | Admitting: Gynecologic Oncology

## 2018-02-24 ENCOUNTER — Encounter (HOSPITAL_COMMUNITY): Payer: Self-pay | Admitting: *Deleted

## 2018-02-24 ENCOUNTER — Other Ambulatory Visit: Payer: Self-pay

## 2018-02-24 DIAGNOSIS — R1900 Intra-abdominal and pelvic swelling, mass and lump, unspecified site: Secondary | ICD-10-CM | POA: Diagnosis not present

## 2018-02-24 DIAGNOSIS — R918 Other nonspecific abnormal finding of lung field: Secondary | ICD-10-CM | POA: Diagnosis not present

## 2018-02-24 LAB — URINALYSIS, ROUTINE W REFLEX MICROSCOPIC
Bilirubin Urine: NEGATIVE
Glucose, UA: NEGATIVE mg/dL
Hgb urine dipstick: NEGATIVE
Ketones, ur: 20 mg/dL — AB
Leukocytes, UA: NEGATIVE
Nitrite: NEGATIVE
PROTEIN: 30 mg/dL — AB
SPECIFIC GRAVITY, URINE: 1.023 (ref 1.005–1.030)
pH: 6 (ref 5.0–8.0)

## 2018-02-24 LAB — CBC
HEMATOCRIT: 32.9 % — AB (ref 36.0–46.0)
Hemoglobin: 10.6 g/dL — ABNORMAL LOW (ref 12.0–15.0)
MCH: 25 pg — ABNORMAL LOW (ref 26.0–34.0)
MCHC: 32.2 g/dL (ref 30.0–36.0)
MCV: 77.6 fL — ABNORMAL LOW (ref 78.0–100.0)
PLATELETS: 760 10*3/uL — AB (ref 150–400)
RBC: 4.24 MIL/uL (ref 3.87–5.11)
RDW: 16.6 % — AB (ref 11.5–15.5)
WBC: 6.8 10*3/uL (ref 4.0–10.5)

## 2018-02-24 LAB — BASIC METABOLIC PANEL
Anion gap: 9 (ref 5–15)
BUN: 8 mg/dL (ref 6–20)
CHLORIDE: 100 mmol/L — AB (ref 101–111)
CO2: 26 mmol/L (ref 22–32)
Calcium: 8.8 mg/dL — ABNORMAL LOW (ref 8.9–10.3)
Creatinine, Ser: 0.66 mg/dL (ref 0.44–1.00)
Glucose, Bld: 139 mg/dL — ABNORMAL HIGH (ref 65–99)
POTASSIUM: 4.1 mmol/L (ref 3.5–5.1)
SODIUM: 135 mmol/L (ref 135–145)

## 2018-02-24 LAB — SURGICAL PCR SCREEN
MRSA, PCR: NEGATIVE
Staphylococcus aureus: POSITIVE — AB

## 2018-02-24 LAB — PREGNANCY, URINE: Preg Test, Ur: NEGATIVE

## 2018-02-24 LAB — ABO/RH: ABO/RH(D): A POS

## 2018-02-24 NOTE — Progress Notes (Signed)
02-24-18 UA result routed to Dr. Gerarda Fraction for review.

## 2018-02-27 MED ORDER — PROPOFOL 10 MG/ML IV BOLUS
INTRAVENOUS | Status: AC
  Filled 2018-02-27: qty 20

## 2018-02-27 MED ORDER — SUGAMMADEX SODIUM 200 MG/2ML IV SOLN
INTRAVENOUS | Status: AC
  Filled 2018-02-27: qty 2

## 2018-02-27 MED ORDER — ROCURONIUM BROMIDE 100 MG/10ML IV SOLN
INTRAVENOUS | Status: AC
  Filled 2018-02-27: qty 1

## 2018-02-27 MED ORDER — DEXAMETHASONE SODIUM PHOSPHATE 10 MG/ML IJ SOLN
INTRAMUSCULAR | Status: AC
  Filled 2018-02-27: qty 1

## 2018-02-27 MED ORDER — SODIUM CHLORIDE 0.9 % IV SOLN
2.0000 g | INTRAVENOUS | Status: AC
  Administered 2018-02-28: 2 g via INTRAVENOUS
  Filled 2018-02-27: qty 2

## 2018-02-27 MED ORDER — FENTANYL CITRATE (PF) 100 MCG/2ML IJ SOLN
INTRAMUSCULAR | Status: AC
Start: 2018-02-27 — End: ?
  Filled 2018-02-27: qty 2

## 2018-02-27 MED ORDER — LIDOCAINE 2% (20 MG/ML) 5 ML SYRINGE
INTRAMUSCULAR | Status: AC
  Filled 2018-02-27: qty 5

## 2018-02-27 MED ORDER — SUCCINYLCHOLINE CHLORIDE 200 MG/10ML IV SOSY
PREFILLED_SYRINGE | INTRAVENOUS | Status: AC
  Filled 2018-02-27: qty 10

## 2018-02-27 MED ORDER — FENTANYL CITRATE (PF) 100 MCG/2ML IJ SOLN
INTRAMUSCULAR | Status: AC
  Filled 2018-02-27: qty 2

## 2018-02-27 MED ORDER — SUGAMMADEX SODIUM 500 MG/5ML IV SOLN
INTRAVENOUS | Status: AC
  Filled 2018-02-27: qty 5

## 2018-02-27 MED ORDER — MIDAZOLAM HCL 2 MG/2ML IJ SOLN
INTRAMUSCULAR | Status: AC
  Filled 2018-02-27: qty 2

## 2018-02-27 MED ORDER — ONDANSETRON HCL 4 MG/2ML IJ SOLN
INTRAMUSCULAR | Status: AC
  Filled 2018-02-27: qty 2

## 2018-02-27 MED ORDER — PHENYLEPHRINE HCL 10 MG/ML IJ SOLN
INTRAMUSCULAR | Status: AC
  Filled 2018-02-27: qty 1

## 2018-02-27 NOTE — Anesthesia Preprocedure Evaluation (Addendum)
Anesthesia Evaluation  Patient identified by MRN, date of birth, ID band Patient awake    Reviewed: Allergy & Precautions, H&P , NPO status , Patient's Chart, lab work & pertinent test results  Airway Mallampati: III  TM Distance: >3 FB Neck ROM: Full    Dental no notable dental hx. (+) Teeth Intact, Dental Advisory Given   Pulmonary neg pulmonary ROS,    Pulmonary exam normal breath sounds clear to auscultation       Cardiovascular Exercise Tolerance: Good negative cardio ROS   Rhythm:Regular Rate:Normal     Neuro/Psych negative neurological ROS  negative psych ROS   GI/Hepatic negative GI ROS, Neg liver ROS,   Endo/Other  negative endocrine ROS  Renal/GU negative Renal ROS  negative genitourinary   Musculoskeletal   Abdominal   Peds  Hematology negative hematology ROS (+)   Anesthesia Other Findings   Reproductive/Obstetrics negative OB ROS                            Anesthesia Physical Anesthesia Plan  ASA: II  Anesthesia Plan: General   Post-op Pain Management:    Induction: Intravenous  PONV Risk Score and Plan: 4 or greater and Ondansetron, Dexamethasone and Midazolam  Airway Management Planned: Oral ETT  Additional Equipment:   Intra-op Plan:   Post-operative Plan: Extubation in OR  Informed Consent: I have reviewed the patients History and Physical, chart, labs and discussed the procedure including the risks, benefits and alternatives for the proposed anesthesia with the patient or authorized representative who has indicated his/her understanding and acceptance.   Dental advisory given  Plan Discussed with: CRNA  Anesthesia Plan Comments:         Anesthesia Quick Evaluation

## 2018-02-28 ENCOUNTER — Other Ambulatory Visit: Payer: Self-pay

## 2018-02-28 ENCOUNTER — Inpatient Hospital Stay (HOSPITAL_COMMUNITY)
Admission: RE | Admit: 2018-02-28 | Discharge: 2018-03-04 | DRG: 339 | Disposition: A | Discharge status: Home / Self Care | Payer: Medicaid Other | Attending: Obstetrics & Gynecology | Admitting: Obstetrics & Gynecology

## 2018-02-28 ENCOUNTER — Encounter (HOSPITAL_COMMUNITY)
Admission: RE | Disposition: A | Discharge status: Home / Self Care | Payer: Self-pay | Source: Home / Self Care | Attending: Obstetrics

## 2018-02-28 ENCOUNTER — Inpatient Hospital Stay (HOSPITAL_COMMUNITY): Payer: Medicaid Other | Admitting: Certified Registered Nurse Anesthetist

## 2018-02-28 ENCOUNTER — Encounter (HOSPITAL_COMMUNITY): Payer: Self-pay | Admitting: *Deleted

## 2018-02-28 DIAGNOSIS — D62 Acute posthemorrhagic anemia: Secondary | ICD-10-CM | POA: Diagnosis not present

## 2018-02-28 DIAGNOSIS — Z79891 Long term (current) use of opiate analgesic: Secondary | ICD-10-CM | POA: Diagnosis not present

## 2018-02-28 DIAGNOSIS — C181 Malignant neoplasm of appendix: Secondary | ICD-10-CM | POA: Diagnosis present

## 2018-02-28 DIAGNOSIS — K567 Ileus, unspecified: Secondary | ICD-10-CM | POA: Diagnosis not present

## 2018-02-28 DIAGNOSIS — C562 Malignant neoplasm of left ovary: Secondary | ICD-10-CM | POA: Diagnosis not present

## 2018-02-28 DIAGNOSIS — C561 Malignant neoplasm of right ovary: Secondary | ICD-10-CM | POA: Diagnosis present

## 2018-02-28 DIAGNOSIS — Z803 Family history of malignant neoplasm of breast: Secondary | ICD-10-CM | POA: Diagnosis not present

## 2018-02-28 DIAGNOSIS — Z880 Allergy status to penicillin: Secondary | ICD-10-CM

## 2018-02-28 DIAGNOSIS — C7961 Secondary malignant neoplasm of right ovary: Secondary | ICD-10-CM | POA: Diagnosis present

## 2018-02-28 DIAGNOSIS — C7962 Secondary malignant neoplasm of left ovary: Secondary | ICD-10-CM | POA: Diagnosis present

## 2018-02-28 DIAGNOSIS — K9189 Other postprocedural complications and disorders of digestive system: Secondary | ICD-10-CM | POA: Diagnosis not present

## 2018-02-28 DIAGNOSIS — R978 Other abnormal tumor markers: Secondary | ICD-10-CM | POA: Diagnosis present

## 2018-02-28 DIAGNOSIS — Y838 Other surgical procedures as the cause of abnormal reaction of the patient, or of later complication, without mention of misadventure at the time of the procedure: Secondary | ICD-10-CM | POA: Diagnosis not present

## 2018-02-28 DIAGNOSIS — Z8614 Personal history of Methicillin resistant Staphylococcus aureus infection: Secondary | ICD-10-CM | POA: Diagnosis not present

## 2018-02-28 DIAGNOSIS — C7982 Secondary malignant neoplasm of genital organs: Secondary | ICD-10-CM | POA: Diagnosis present

## 2018-02-28 DIAGNOSIS — C7960 Secondary malignant neoplasm of unspecified ovary: Secondary | ICD-10-CM | POA: Diagnosis not present

## 2018-02-28 DIAGNOSIS — D373 Neoplasm of uncertain behavior of appendix: Secondary | ICD-10-CM

## 2018-02-28 DIAGNOSIS — Z7982 Long term (current) use of aspirin: Secondary | ICD-10-CM | POA: Diagnosis not present

## 2018-02-28 DIAGNOSIS — Z91012 Allergy to eggs: Secondary | ICD-10-CM

## 2018-02-28 DIAGNOSIS — Z800 Family history of malignant neoplasm of digestive organs: Secondary | ICD-10-CM | POA: Diagnosis not present

## 2018-02-28 DIAGNOSIS — C785 Secondary malignant neoplasm of large intestine and rectum: Secondary | ICD-10-CM | POA: Diagnosis not present

## 2018-02-28 DIAGNOSIS — M7918 Myalgia, other site: Secondary | ICD-10-CM | POA: Diagnosis present

## 2018-02-28 DIAGNOSIS — M1990 Unspecified osteoarthritis, unspecified site: Secondary | ICD-10-CM | POA: Diagnosis present

## 2018-02-28 DIAGNOSIS — R1900 Intra-abdominal and pelvic swelling, mass and lump, unspecified site: Secondary | ICD-10-CM | POA: Diagnosis present

## 2018-02-28 DIAGNOSIS — C786 Secondary malignant neoplasm of retroperitoneum and peritoneum: Secondary | ICD-10-CM | POA: Diagnosis present

## 2018-02-28 HISTORY — PX: APPENDECTOMY: SHX54

## 2018-02-28 HISTORY — PX: LAPAROTOMY: SHX154

## 2018-02-28 HISTORY — PX: SALPINGOOPHORECTOMY: SHX82

## 2018-02-28 LAB — PREPARE RBC (CROSSMATCH)

## 2018-02-28 SURGERY — LAPAROTOMY, EXPLORATORY
Anesthesia: General

## 2018-02-28 MED ORDER — MIDAZOLAM HCL 2 MG/2ML IJ SOLN
INTRAMUSCULAR | Status: AC
  Filled 2018-02-28: qty 2

## 2018-02-28 MED ORDER — MIDAZOLAM HCL 5 MG/5ML IJ SOLN
INTRAMUSCULAR | Status: DC | PRN
  Administered 2018-02-28: 2 mg via INTRAVENOUS

## 2018-02-28 MED ORDER — FENTANYL CITRATE (PF) 100 MCG/2ML IJ SOLN
INTRAMUSCULAR | Status: AC
  Filled 2018-02-28: qty 2

## 2018-02-28 MED ORDER — ROCURONIUM BROMIDE 100 MG/10ML IV SOLN
INTRAVENOUS | Status: AC
  Filled 2018-02-28: qty 1

## 2018-02-28 MED ORDER — HYDROMORPHONE HCL 2 MG/ML IJ SOLN
INTRAMUSCULAR | Status: AC
  Filled 2018-02-28: qty 1

## 2018-02-28 MED ORDER — SODIUM CHLORIDE 0.9 % IV SOLN
2.0000 g | INTRAVENOUS | Status: AC
  Administered 2018-02-28: 2 g via INTRAVENOUS
  Filled 2018-02-28: qty 2

## 2018-02-28 MED ORDER — LIDOCAINE 20MG/ML (2%) 15 ML SYRINGE OPTIME
INTRAMUSCULAR | Status: DC | PRN
  Administered 2018-02-28: 1.5 mg/kg/h via INTRAVENOUS

## 2018-02-28 MED ORDER — IBUPROFEN 200 MG PO TABS: 600.0000 mg | ORAL_TABLET | Freq: Four times a day (QID) | ORAL | Status: DC

## 2018-02-28 MED ORDER — PROPOFOL 10 MG/ML IV BOLUS
INTRAVENOUS | Status: DC | PRN
  Administered 2018-02-28: 100 mg via INTRAVENOUS

## 2018-02-28 MED ORDER — SODIUM CHLORIDE 0.9 % IJ SOLN
INTRAMUSCULAR | Status: AC
  Filled 2018-02-28: qty 50

## 2018-02-28 MED ORDER — HYDROMORPHONE HCL 1 MG/ML IJ SOLN: 0.5000 mg | INTRAMUSCULAR | Status: DC | PRN

## 2018-02-28 MED ORDER — KETOROLAC TROMETHAMINE 30 MG/ML IJ SOLN: 15.0000 mg | Freq: Four times a day (QID) | INTRAMUSCULAR | Status: AC

## 2018-02-28 MED ORDER — DEXAMETHASONE SODIUM PHOSPHATE 10 MG/ML IJ SOLN
INTRAMUSCULAR | Status: AC
  Filled 2018-02-28: qty 1

## 2018-02-28 MED ORDER — HYDROMORPHONE HCL 1 MG/ML IJ SOLN
0.2500 mg | INTRAMUSCULAR | Status: DC | PRN
  Administered 2018-02-28 (×4): 0.5 mg via INTRAVENOUS

## 2018-02-28 MED ORDER — DEXAMETHASONE SODIUM PHOSPHATE 4 MG/ML IJ SOLN
4.0000 mg | INTRAMUSCULAR | Status: AC
  Administered 2018-02-28: 10 mg via INTRAVENOUS

## 2018-02-28 MED ORDER — SCOPOLAMINE 1 MG/3DAYS TD PT72
1.0000 | MEDICATED_PATCH | TRANSDERMAL | Status: DC
  Administered 2018-02-28: 1.5 mg via TRANSDERMAL
  Filled 2018-02-28: qty 1

## 2018-02-28 MED ORDER — STERILE WATER FOR IRRIGATION IR SOLN
Status: DC | PRN
  Administered 2018-02-28: 2000 mL

## 2018-02-28 MED ORDER — SODIUM CHLORIDE 0.9 % IJ SOLN
INTRAMUSCULAR | Status: AC
Start: 2018-02-28 — End: ?
  Filled 2018-02-28: qty 20

## 2018-02-28 MED ORDER — KETOROLAC TROMETHAMINE 30 MG/ML IJ SOLN
15.0000 mg | Freq: Four times a day (QID) | INTRAMUSCULAR | Status: AC
  Administered 2018-02-28 – 2018-03-01 (×4): 15 mg via INTRAVENOUS
  Filled 2018-02-28 (×4): qty 1

## 2018-02-28 MED ORDER — BUPIVACAINE LIPOSOME 1.3 % IJ SUSP
20.0000 mL | Freq: Once | INTRAMUSCULAR | Status: AC
  Administered 2018-02-28: 20 mL
  Filled 2018-02-28: qty 20

## 2018-02-28 MED ORDER — OXYCODONE HCL 5 MG PO TABS
5.0000 mg | ORAL_TABLET | ORAL | Status: DC | PRN
  Administered 2018-03-01 – 2018-03-04 (×6): 5 mg via ORAL
  Filled 2018-02-28 (×6): qty 1

## 2018-02-28 MED ORDER — SUGAMMADEX SODIUM 200 MG/2ML IV SOLN
INTRAVENOUS | Status: AC
  Filled 2018-02-28: qty 2

## 2018-02-28 MED ORDER — SODIUM CHLORIDE 0.9 % IJ SOLN
INTRAMUSCULAR | Status: DC | PRN
  Administered 2018-02-28: 60 mL

## 2018-02-28 MED ORDER — PHENYLEPHRINE 40 MCG/ML (10ML) SYRINGE FOR IV PUSH (FOR BLOOD PRESSURE SUPPORT)
PREFILLED_SYRINGE | INTRAVENOUS | Status: DC | PRN
  Administered 2018-02-28: 80 ug via INTRAVENOUS

## 2018-02-28 MED ORDER — ONDANSETRON HCL 4 MG/2ML IJ SOLN
INTRAMUSCULAR | Status: AC
Start: 2018-02-28 — End: ?
  Filled 2018-02-28: qty 2

## 2018-02-28 MED ORDER — LIDOCAINE 2% (20 MG/ML) 5 ML SYRINGE
INTRAMUSCULAR | Status: DC | PRN
  Administered 2018-02-28: 60 mg via INTRAVENOUS

## 2018-02-28 MED ORDER — SUGAMMADEX SODIUM 200 MG/2ML IV SOLN
INTRAVENOUS | Status: DC | PRN
  Administered 2018-02-28: 150 mg via INTRAVENOUS

## 2018-02-28 MED ORDER — ONDANSETRON HCL 4 MG/2ML IJ SOLN
INTRAMUSCULAR | Status: DC | PRN
  Administered 2018-02-28: 4 mg via INTRAVENOUS

## 2018-02-28 MED ORDER — HYDROMORPHONE HCL 1 MG/ML IJ SOLN
INTRAMUSCULAR | Status: AC
  Filled 2018-02-28: qty 2

## 2018-02-28 MED ORDER — SODIUM CHLORIDE 0.9 % IR SOLN
Status: DC | PRN
  Administered 2018-02-28: 4000 mL

## 2018-02-28 MED ORDER — BUPIVACAINE HCL (PF) 0.25 % IJ SOLN
INTRAMUSCULAR | Status: AC
  Filled 2018-02-28: qty 30

## 2018-02-28 MED ORDER — LIDOCAINE 2% (20 MG/ML) 5 ML SYRINGE
INTRAMUSCULAR | Status: AC
  Filled 2018-02-28: qty 5

## 2018-02-28 MED ORDER — LIDOCAINE 2% (20 MG/ML) 5 ML SYRINGE
INTRAMUSCULAR | Status: AC
  Filled 2018-02-28: qty 10

## 2018-02-28 MED ORDER — CHEWING GUM (ORBIT) SUGAR FREE
1.0000 | CHEWING_GUM | Freq: Three times a day (TID) | ORAL | Status: AC
  Administered 2018-03-01 – 2018-03-03 (×8): 1 via ORAL
  Filled 2018-02-28: qty 1

## 2018-02-28 MED ORDER — CELECOXIB 200 MG PO CAPS
400.0000 mg | ORAL_CAPSULE | ORAL | Status: AC
  Administered 2018-02-28: 400 mg via ORAL
  Filled 2018-02-28: qty 2

## 2018-02-28 MED ORDER — FENTANYL CITRATE (PF) 100 MCG/2ML IJ SOLN
INTRAMUSCULAR | Status: DC | PRN
  Administered 2018-02-28 (×6): 50 ug via INTRAVENOUS

## 2018-02-28 MED ORDER — ENSURE ENLIVE PO LIQD
237.0000 mL | Freq: Two times a day (BID) | ORAL | Status: DC
  Administered 2018-03-01 – 2018-03-04 (×7): 237 mL via ORAL

## 2018-02-28 MED ORDER — HYDROMORPHONE HCL 1 MG/ML IJ SOLN
INTRAMUSCULAR | Status: DC | PRN
  Administered 2018-02-28 (×2): 0.5 mg via INTRAVENOUS

## 2018-02-28 MED ORDER — LACTATED RINGERS IV SOLN
INTRAVENOUS | Status: DC
  Administered 2018-02-28 (×3): via INTRAVENOUS

## 2018-02-28 MED ORDER — ENOXAPARIN (LOVENOX) PATIENT EDUCATION KIT
PACK | Freq: Once | Status: AC
  Administered 2018-03-01: 10:00:00
  Filled 2018-02-28: qty 1

## 2018-02-28 MED ORDER — PHENYLEPHRINE 40 MCG/ML (10ML) SYRINGE FOR IV PUSH (FOR BLOOD PRESSURE SUPPORT)
PREFILLED_SYRINGE | INTRAVENOUS | Status: AC
  Filled 2018-02-28: qty 10

## 2018-02-28 MED ORDER — BUPIVACAINE HCL 0.25 % IJ SOLN
INTRAMUSCULAR | Status: DC | PRN
  Administered 2018-02-28: 20 mL

## 2018-02-28 MED ORDER — ACETAMINOPHEN 500 MG PO TABS
1000.0000 mg | ORAL_TABLET | Freq: Four times a day (QID) | ORAL | Status: DC
  Administered 2018-03-01 – 2018-03-04 (×12): 1000 mg via ORAL
  Filled 2018-02-28 (×14): qty 2

## 2018-02-28 MED ORDER — ONDANSETRON HCL 4 MG/2ML IJ SOLN: 4.0000 mg | Freq: Four times a day (QID) | INTRAMUSCULAR | Status: DC | PRN

## 2018-02-28 MED ORDER — ACETAMINOPHEN 500 MG PO TABS
1000.0000 mg | ORAL_TABLET | ORAL | Status: AC
  Administered 2018-02-28: 1000 mg via ORAL
  Filled 2018-02-28: qty 2

## 2018-02-28 MED ORDER — ONDANSETRON HCL 4 MG PO TABS: 4.0000 mg | ORAL_TABLET | Freq: Four times a day (QID) | ORAL | Status: DC | PRN

## 2018-02-28 MED ORDER — PROPOFOL 10 MG/ML IV BOLUS
INTRAVENOUS | Status: AC
Start: 2018-02-28 — End: ?
  Filled 2018-02-28: qty 20

## 2018-02-28 MED ORDER — GABAPENTIN 300 MG PO CAPS
300.0000 mg | ORAL_CAPSULE | ORAL | Status: AC
  Administered 2018-02-28: 300 mg via ORAL
  Filled 2018-02-28: qty 1

## 2018-02-28 MED ORDER — PREGABALIN 75 MG PO CAPS
75.0000 mg | ORAL_CAPSULE | Freq: Two times a day (BID) | ORAL | Status: DC
  Administered 2018-03-01 – 2018-03-04 (×7): 75 mg via ORAL
  Filled 2018-02-28 (×7): qty 1

## 2018-02-28 MED ORDER — FENTANYL CITRATE (PF) 250 MCG/5ML IJ SOLN
INTRAMUSCULAR | Status: AC
  Filled 2018-02-28: qty 5

## 2018-02-28 MED ORDER — NON FORMULARY: 1.0000 [IU] | Freq: Three times a day (TID) | Status: DC

## 2018-02-28 MED ORDER — ENOXAPARIN SODIUM 40 MG/0.4ML ~~LOC~~ SOLN
40.0000 mg | SUBCUTANEOUS | Status: DC
  Administered 2018-03-01 – 2018-03-04 (×4): 40 mg via SUBCUTANEOUS
  Filled 2018-02-28 (×4): qty 0.4

## 2018-02-28 MED ORDER — SODIUM CHLORIDE 0.9 % IV SOLN: 10.0000 mL/h | Freq: Once | INTRAVENOUS | Status: DC

## 2018-02-28 MED ORDER — KCL IN DEXTROSE-NACL 20-5-0.45 MEQ/L-%-% IV SOLN
INTRAVENOUS | Status: DC
  Administered 2018-02-28 – 2018-03-01 (×2): via INTRAVENOUS
  Filled 2018-02-28 (×2): qty 1000

## 2018-02-28 MED ORDER — ROCURONIUM BROMIDE 10 MG/ML (PF) SYRINGE
PREFILLED_SYRINGE | INTRAVENOUS | Status: DC | PRN
  Administered 2018-02-28: 20 mg via INTRAVENOUS
  Administered 2018-02-28: 10 mg via INTRAVENOUS
  Administered 2018-02-28: 50 mg via INTRAVENOUS

## 2018-02-28 SURGICAL SUPPLY — 56 items
ATTRACTOMAT 16X20 MAGNETIC DRP (DRAPES) ×4 IMPLANT
BLADE EXTENDED COATED 6.5IN (ELECTRODE) ×4 IMPLANT
CHLORAPREP W/TINT 26ML (MISCELLANEOUS) ×4 IMPLANT
CLIP VESOCCLUDE LG 6/CT (CLIP) ×4 IMPLANT
CLIP VESOCCLUDE MED 6/CT (CLIP) ×4 IMPLANT
CLIP VESOCCLUDE MED LG 6/CT (CLIP) ×4 IMPLANT
CONT SPEC 4OZ CLIKSEAL STRL BL (MISCELLANEOUS) ×4 IMPLANT
COVER SURGICAL LIGHT HANDLE (MISCELLANEOUS) ×4 IMPLANT
DERMABOND ADVANCED (GAUZE/BANDAGES/DRESSINGS) ×4
DERMABOND ADVANCED .7 DNX12 (GAUZE/BANDAGES/DRESSINGS) ×4 IMPLANT
DRAPE INCISE 23X17 IOBAN STRL (DRAPES) ×2
DRAPE INCISE IOBAN 23X17 STRL (DRAPES) ×2 IMPLANT
DRAPE INCISE IOBAN 66X45 STRL (DRAPES) IMPLANT
DRAPE UNDERBUTTOCKS STRL (DRAPE) ×4 IMPLANT
DRAPE WARM FLUID 44X44 (DRAPE) ×4 IMPLANT
DRSG OPSITE POSTOP 4X12 (GAUZE/BANDAGES/DRESSINGS) ×4 IMPLANT
DRSG OPSITE POSTOP 4X6 (GAUZE/BANDAGES/DRESSINGS) ×4 IMPLANT
ELECT REM PT RETURN 15FT ADLT (MISCELLANEOUS) ×4 IMPLANT
GAUZE 4X4 16PLY RFD (DISPOSABLE) IMPLANT
GAUZE SPONGE 4X4 12PLY STRL (GAUZE/BANDAGES/DRESSINGS) IMPLANT
GLOVE BIOGEL PI IND STRL 7.0 (GLOVE) ×4 IMPLANT
GLOVE BIOGEL PI INDICATOR 7.0 (GLOVE) ×4
GLOVE SURG SS PI 6.5 STRL IVOR (GLOVE) ×16 IMPLANT
GOWN STRL REUS W/ TWL LRG LVL3 (GOWN DISPOSABLE) ×2 IMPLANT
GOWN STRL REUS W/TWL LRG LVL3 (GOWN DISPOSABLE) ×2 IMPLANT
HANDLE SUCTION POOLE (INSTRUMENTS) ×2 IMPLANT
KIT BASIN OR (CUSTOM PROCEDURE TRAY) ×4 IMPLANT
LIGASURE IMPACT 36 18CM CVD LR (INSTRUMENTS) IMPLANT
NEEDLE HYPO 22GX1.5 SAFETY (NEEDLE) ×8 IMPLANT
NS IRRIG 1000ML POUR BTL (IV SOLUTION) IMPLANT
PACK GENERAL/GYN (CUSTOM PROCEDURE TRAY) ×4 IMPLANT
RETAINER VISCERA MED (MISCELLANEOUS) IMPLANT
SEPRAFILM MEMBRANE 5X6 (MISCELLANEOUS) IMPLANT
SHEET LAVH (DRAPES) ×4 IMPLANT
SPONGE LAP 18X18 RF (DISPOSABLE) ×16 IMPLANT
SUCTION POOLE HANDLE (INSTRUMENTS) ×4
SUT MNCRL AB 4-0 PS2 18 (SUTURE) ×8 IMPLANT
SUT PDS AB 1 TP1 96 (SUTURE) ×8 IMPLANT
SUT PLAIN 2 0 XLH (SUTURE) IMPLANT
SUT PROLENE 0 CT 1 30 (SUTURE) ×4 IMPLANT
SUT SILK 2 0 (SUTURE) ×2
SUT SILK 2-0 18XBRD TIE 12 (SUTURE) IMPLANT
SUT SILK 2-0 30XBRD TIE 12 (SUTURE) ×2 IMPLANT
SUT VIC AB 0 CT1 18XCR BRD 8 (SUTURE) ×2 IMPLANT
SUT VIC AB 0 CT1 36 (SUTURE) IMPLANT
SUT VIC AB 0 CT1 8-18 (SUTURE) ×2
SUT VIC AB 4-0 PS2 18 (SUTURE) IMPLANT
SUT VIC AB 4-0 PS2 27 (SUTURE) ×12 IMPLANT
SUT VICRYL 0 TIES 12 18 (SUTURE) ×4 IMPLANT
SYR 30ML LL (SYRINGE) ×8 IMPLANT
SYR BULB IRRIGATION 50ML (SYRINGE) ×4 IMPLANT
TOWEL OR 17X26 10 PK STRL BLUE (TOWEL DISPOSABLE) ×4 IMPLANT
TOWEL OR NON WOVEN STRL DISP B (DISPOSABLE) ×4 IMPLANT
TRAY FOLEY MTR SLVR 14FR STAT (SET/KITS/TRAYS/PACK) ×4 IMPLANT
TUBE KAMVAC SUCTION (TUBING) ×4 IMPLANT
YANKAUER SUCT BULB TIP NO VENT (SUCTIONS) ×4 IMPLANT

## 2018-02-28 NOTE — Op Note (Signed)
Date: 02/28/18  Preoperative Diagnosis:  1. Adnexal Mass 2. Elevated Tumor markers  Postoperative Diagnosis:  1. 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  2. Appendiceal mass 3. Peritoneal metastases  Procedure(s) Performed:  1. Exploratory laparotomy with biopsy of umbilical peritoneum where gross disease found 2. Bilateral salpingo-oophorectomy  3. Appendectomy  Surgeon: Mart Piggs, MD  Assistant Surgeon: Lahoma Crocker, M.D. (an MD assistant was necessary for tissue manipulation, management of instrumentation, retraction and positioning due to the complexity of the case and hospital policies).  Anesthesia: GETA  Specimens: Bilateral tube(s) / ovaries. Umbilical peritoneal tumor. Appendix.  Estimated Blood Loss: 50 mL.    Complications: None.   Operative Findings:   Peritoneal disease entire right diaphragm with some hepatic capsule rind likely involved. Gallbladder surface has small volume disease. The lesser omentum had small volume disease. The spleen has 2 cystic implants and one feels adherent on the left posterior surface. The greater omentum has small volume disease. The bilateral gutters have disease with more on the left sigmoid. Larger volume disease areas include the left pelvic brim and bladder peritoneum. Small miliary like lesions on small bowel mesentary. 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  General surgery consulted intraop and we agreed on limiting procedure to those performed.  Procedure in Detail:    The patient was taken the operating room.  General endotracheal anesthesia was performed without complications.  The patient was placed in dorsal lithotomy position.  She was prepped and draped in sterile fashion.  A timeout was performed.     A Foley catheter was placed to gravity by  me.   A midline vertical incision was made and carried through the subcutaneous tissue to the fascia. The fascial incision was made and extended superiorally and inferiorally. The rectus muscles were separated. The peritoneum was identified and entered sharply. Peritoneal incision was extended longitudinally. The undersurface of the umbilicus had peritoneal disease worrisome for carcinoma. Tissue here was sent for frozen section. The right ovarian mass filled the entire abdominal and pelvic cavity. The omentum was adherent anteriorly. This was separated and peeled off the mass. The lateral adhesions were taken down with gentle blunt dissection. In the pelvis leakage of mucinous fluid was noted, either preoperatively present or incidental. I suspect adherent to the disease or extending down anteriorly to the uterus/bladder. Once the mass was freed on all sides it was delivered through the large abdominal incision. I entered the right pelvic sidewall.  I elevated the pelvic peritoneum and opened the retroperitoneal space identifying the ureter.  The overlying infundibulopelvic ligament was isolated, clamped, transected, and ligated.  The adnexa was then skeletonized towards the pelvis. A heaney clamp was placed across the utero-ovarian ligament and tubal insertion site.  Specimen was then transected sharply and sent for frozen section.  The pedicle was then suture-ligated with a 0 Vicryl suture.  Hemostasis was noted.  The umbilical tissue returned from frozen as "mucinous tumor NOS" so I did send the right ovary for pathology.  A Bookwalter retractor was then placed. Exploration revealed the above findings.   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 I called general surgery to confirm a plan limited to removal of the contralateral adnexa, which appeared to be involved with disease and removal of the appendix, which was enlarged with a tumor  approximately 3x2cm.  After packing the bowel into the upper abdomen, I entered the left pelvic sidewall.  I elevated the pelvic peritoneum and opened the retroperitoneal space identifying the ureter.  The overlying infundibulopelvic ligament was isolated, clamped, transected, and ligated.  The adnexa was then skeletonized towards the pelvis. A heaney clamp was placed across the utero-ovarian ligament and tubal insertion site.  Specimen was then transected sharply and sent for frozen section.  The pedicle was then suture-ligated with a 0 Vicryl suture.  Hemostasis was noted.   Copious irrigation was used.  Hemostasis was noted. Initial lap counts were correct.   The fascia was reapproximated with two #1-looped PDS using a total of two sutures, starting at either end, meeting in the midline and tying. The subcutaneous layer was then irrigated. The dermis was reapproximated with interrupted 4-0 Vicryl and then running 4-0 Monocryl, covered with Dermabond and then a Honey comb dressing.   The patient tolerated the procedure well. Sponge, lap, and needle counts were correct x 2.

## 2018-02-28 NOTE — Anesthesia Procedure Notes (Signed)
Procedure Name: Intubation Date/Time: 02/28/2018 7:36 AM Performed by: Claudia Desanctis, CRNA Pre-anesthesia Checklist: Patient identified, Emergency Drugs available, Suction available and Patient being monitored Patient Re-evaluated:Patient Re-evaluated prior to induction Oxygen Delivery Method: Circle system utilized Preoxygenation: Pre-oxygenation with 100% oxygen Induction Type: IV induction Ventilation: Mask ventilation without difficulty Laryngoscope Size: 2 and Miller Grade View: Grade I Tube type: Oral Tube size: 7.0 mm Number of attempts: 1 Airway Equipment and Method: Stylet Placement Confirmation: ETT inserted through vocal cords under direct vision,  positive ETCO2 and breath sounds checked- equal and bilateral Secured at: 21 cm Tube secured with: Tape Dental Injury: Teeth and Oropharynx as per pre-operative assessment

## 2018-02-28 NOTE — Interval H&P Note (Signed)
History and Physical Interval Note:  02/28/2018 7:36 AM  Teresa Richardson  has presented today for surgery, with the diagnosis of PELVIC MASS  The various methods of treatment have been discussed with the patient and family. After consideration of risks, benefits and other options for treatment, the patient has consented to  Procedure(s): EXPLORATORY LAPAROTOMY (N/A) SALPINGO OOPHORECTOMY (Bilateral) POSSIBLE TOTAL HYSTERECTOMY ABDOMINAL (N/A) POSSIBLE STAGING (N/A) as a surgical intervention .  The patient's history has been reviewed, patient examined, no change in status, stable for surgery.  I have reviewed the patient's chart and labs.  Questions were answered to the patient's satisfaction.   Patient aware and signed for possible appendectomy  [name]

## 2018-02-28 NOTE — Transfer of Care (Signed)
Immediate Anesthesia Transfer of Care Note  Patient: Teresa Richardson  Procedure(s) Performed: EXPLORATORY LAPAROTOMY, PERITONEAL BIOPSIES (N/A ) SALPINGO OOPHORECTOMY (Bilateral ) APPENDECTOMY (N/A )  Patient Location: PACU  Anesthesia Type:General  Level of Consciousness: awake, alert , oriented and patient cooperative  Airway & Oxygen Therapy: Patient Spontanous Breathing and Patient connected to face mask  Post-op Assessment: Report given to RN and Post -op Vital signs reviewed and stable  Post vital signs: Reviewed and stable  Last Vitals:  Vitals Value Taken Time  BP 134/75 02/28/2018 10:52 AM  Temp    Pulse 87 02/28/2018 10:53 AM  Resp 22 02/28/2018 10:53 AM  SpO2 100 % 02/28/2018 10:53 AM  Vitals shown include unvalidated device data.  Last Pain:  Vitals:   02/28/18 0625  TempSrc:   PainSc: 10-Worst pain ever      Patients Stated Pain Goal: 4 (76/14/70 9295)  Complications: No apparent anesthesia complications

## 2018-02-28 NOTE — Anesthesia Postprocedure Evaluation (Signed)
Anesthesia Post Note  Patient: Teresa Richardson  Procedure(s) Performed: EXPLORATORY LAPAROTOMY, PERITONEAL BIOPSIES (N/A ) SALPINGO OOPHORECTOMY (Bilateral ) APPENDECTOMY (N/A )     Patient location during evaluation: PACU Anesthesia Type: General Level of consciousness: awake and alert Pain management: pain level controlled Vital Signs Assessment: post-procedure vital signs reviewed and stable Respiratory status: spontaneous breathing, nonlabored ventilation, respiratory function stable and patient connected to nasal cannula oxygen Cardiovascular status: blood pressure returned to baseline and stable Postop Assessment: no apparent nausea or vomiting Anesthetic complications: no    Last Vitals:  Vitals:   02/28/18 1145 02/28/18 1200  BP: 131/74 131/72  Pulse: 98 98  Resp: 16 12  Temp:  36.8 C  SpO2: 97% 96%    Last Pain:  Vitals:   02/28/18 1200  TempSrc:   PainSc: Asleep                 Jayren Cease,W. EDMOND

## 2018-03-01 LAB — BASIC METABOLIC PANEL
Anion gap: 5 (ref 5–15)
BUN: 6 mg/dL (ref 6–20)
CHLORIDE: 108 mmol/L (ref 98–111)
CO2: 28 mmol/L (ref 22–32)
Calcium: 7.9 mg/dL — ABNORMAL LOW (ref 8.9–10.3)
Creatinine, Ser: 0.57 mg/dL (ref 0.44–1.00)
GFR calc Af Amer: >60 mL/min (ref >60)
GFR calc non Af Amer: >60 mL/min (ref >60)
Glucose, Bld: 113 mg/dL — ABNORMAL HIGH (ref 70–99)
POTASSIUM: 4 mmol/L (ref 3.5–5.1)
Sodium: 141 mmol/L (ref 135–145)

## 2018-03-01 LAB — CBC
HCT: 29 % — ABNORMAL LOW (ref 36.0–46.0)
HEMOGLOBIN: 9.4 g/dL — AB (ref 12.0–15.0)
MCH: 24.8 pg — AB (ref 26.0–34.0)
MCHC: 32.4 g/dL (ref 30.0–36.0)
MCV: 76.5 fL — AB (ref 78.0–100.0)
Platelets: 704 10*3/uL — ABNORMAL HIGH (ref 150–400)
RBC: 3.79 MIL/uL — ABNORMAL LOW (ref 3.87–5.11)
RDW: 16.7 % — ABNORMAL HIGH (ref 11.5–15.5)
WBC: 9.2 10*3/uL (ref 4.0–10.5)

## 2018-03-01 MED ORDER — IBUPROFEN 200 MG PO TABS
600.0000 mg | ORAL_TABLET | Freq: Four times a day (QID) | ORAL | Status: DC | PRN
  Administered 2018-03-01 – 2018-03-04 (×3): 600 mg via ORAL
  Filled 2018-03-01 (×3): qty 3

## 2018-03-02 ENCOUNTER — Other Ambulatory Visit: Payer: Self-pay | Admitting: Obstetrics

## 2018-03-02 ENCOUNTER — Telehealth: Payer: Self-pay

## 2018-03-02 DIAGNOSIS — C181 Malignant neoplasm of appendix: Secondary | ICD-10-CM

## 2018-03-02 MED ORDER — DOCUSATE SODIUM 100 MG PO CAPS
100.0000 mg | ORAL_CAPSULE | Freq: Two times a day (BID) | ORAL | Status: DC
  Administered 2018-03-02 – 2018-03-04 (×5): 100 mg via ORAL
  Filled 2018-03-02 (×5): qty 1

## 2018-03-02 NOTE — Progress Notes (Signed)
2 Days Post-Op Procedure(s) (LRB): EXPLORATORY LAPAROTOMY, PERITONEAL BIOPSIES (N/A) SALPINGO OOPHORECTOMY (Bilateral) APPENDECTOMY (N/A)  Subjective: Patient reports  no problems voiding, mild incision pain. No flatus   Objective: Vital signs in last 24 hours: Temp:  [98.2 F (36.8 C)-98.9 F (37.2 C)] 98.6 F (37 C) (06/27 0554) Pulse Rate:  [75-90] 85 (06/27 0554) Resp:  [12-18] 16 (06/27 0554) BP: (108-134)/(55-74) 128/73 (06/27 0554) SpO2:  [97 %] 97 % (06/27 0554) Last BM Date: 02/27/18  Intake/Output from previous day: 06/26 0701 - 06/27 0700 In: 2750 [P.O.:2160; I.V.:590] Out: 3550 [Urine:3550]  Physical Examination: General: alert Resp: clear to auscultation bilaterally Cardio: regular rate and rhythm, S1, S2 normal, no murmur, click, rub or gallop GI: Softly distended, I: C/D/I Extremities: extremities normal, atraumatic, no cyanosis or edema and Homans sign is negative, no sign of DVT  Labs:       Assessment:  52 y.o. s/p Procedure(s): EXPLORATORY LAPAROTOMY, PERITONEAL BIOPSIES SALPINGO OOPHORECTOMY APPENDECTOMY: stable Pain:  Pain is well-controlled on PCA or oral medications.  Heme:  Anemia: stable; c/w intraoperative blood loss   GI:  Tolerating po: Yes  Slowly returning bowel function.  FEN: Electrolytes in range.  Prophylaxis: pharmacologic prophylaxis (with any of the following: enoxaparin (Lovenox) 40mg  SQ 2 hours prior to surgery then every day) and intermittent pneumatic compression boots.  Plan: Encourage ambulation Discontinue IV fluids  The patient is to be discharged to home.   LOS: 2 days    Lahoma Crocker 03/02/2018, 10:13 AM

## 2018-03-02 NOTE — Progress Notes (Signed)
  1533/1533-01  Past Medical History:  Diagnosis Date  . Arthritis   . Constipation   . MRSA (methicillin resistant staph aureus) culture positive   . Myofascial pain syndrome    Myofascial pain disorder     Subjective: Patient reports Pain controlled. No nausea. Asking when can eat. Currently NPO.  .    Objective: Vital signs and UO in last 24 hours: reviewed and WNL  Physical Examination: Gen - no distress Resp - no labored respirations. GI - soft, NT. Wound with dressing CDI. Nondistended Ext no edema, NT  Labs:      CBC Latest Ref Rng & Units 03/01/2018 02/24/2018  WBC 4.0 - 10.5 K/uL 9.2 6.8  Hemoglobin 12.0 - 15.0 g/dL 9.4(L) 10.6(L)  Hematocrit 36.0 - 46.0 % 29.0(L) 32.9(L)  Platelets 150 - 400 K/uL 704(H) 760(H)   BMP Latest Ref Rng & Units 03/01/2018 02/24/2018 02/17/2018  Glucose 70 - 99 mg/dL 113(H) 139(H) 98  BUN 6 - 20 mg/dL 6 8 9   Creatinine 0.44 - 1.00 mg/dL 0.57 0.66 0.70  Sodium 135 - 145 mmol/L 141 135 135(L)  Potassium 3.5 - 5.1 mmol/L 4.0 4.1 3.8  Chloride 98 - 111 mmol/L 108 100(L) 98  CO2 22 - 32 mmol/L 28 26 23   Calcium 8.9 - 10.3 mg/dL 7.9(L) 8.8(L) 10.3      Assessment/Plan:  52 y.o. s/p Procedure(s): EXPLORATORY LAPAROTOMY, PERITONEAL BIOPSIES SALPINGO OOPHORECTOMY APPENDECTOMY: stable  Pain:  Pain is  controlled on IV and/or oral medications.  Neuro: no issues  Pulm: no issues  Heme: stable  CV:  No issues  Prophylaxis: pharmacologic prophylaxis (with any of the following: lovenox) and intermittent pneumatic compression boots.  GI:    . Tolerating po: Yes     GU: . adequate urine output  FEN:  . IVF decrease today / dc . Elec WNL this am . Diet advance   ID: no isssues   POD1 routine postop for now Anticipate plan for dc home with Lovenox and likely referral once pathology returns to [facility] Discussed this possible plan with patient   Teresa Richardson 03/02/2018, 2:01 PM

## 2018-03-02 NOTE — Telephone Encounter (Signed)
Teresa Richardson from pathology would like to discuss latest path report with Dr Gerarda Fraction.  Dr Gerarda Fraction not in office, gave him her pager number and will in-basket her as well.

## 2018-03-03 ENCOUNTER — Telehealth: Payer: Self-pay | Admitting: *Deleted

## 2018-03-03 NOTE — Telephone Encounter (Signed)
Faxed referral to Dr. Clovis Riley office at [facility].

## 2018-03-03 NOTE — Progress Notes (Signed)
3 Days Post-Op Procedure(s) (LRB): EXPLORATORY LAPAROTOMY, PERITONEAL BIOPSIES (N/A) SALPINGO OOPHORECTOMY (Bilateral) APPENDECTOMY (N/A)  Subjective: Patient reports  no problems voiding, mild incision pain. Passed flatus last night after experiencing abdominal cramping pains.  Still feels very distended in abdomen.  Does not feel ready for discharge today given abdominal discomfort and distension.   Objective: Vital signs in last 24 hours: Temp:  [97.9 F (36.6 C)-98.3 F (36.8 C)] 98.1 F (36.7 C) (06/28 0557) Pulse Rate:  [81-85] 83 (06/28 0557) Resp:  [15-18] 18 (06/28 0557) BP: (124-135)/(66-80) 133/66 (06/28 0557) SpO2:  [97 %-98 %] 98 % (06/28 0557) Last BM Date: 02/27/18  Intake/Output from previous day: 06/27 0701 - 06/28 0700 In: 1000 [P.O.:1000] Out: 3100 [Urine:3100]  Physical Examination: General: alert Resp: clear to auscultation bilaterally Cardio: regular rate and rhythm, S1, S2 normal, no murmur, click, rub or gallop GI: Softly distended, I: C/D/I Extremities: extremities normal, atraumatic, no cyanosis or edema and Homans sign is negative, no sign of DVT  Labs:     CBC    Component Value Date/Time   WBC 9.2 03/01/2018 0440   RBC 3.79 (L) 03/01/2018 0440   HGB 9.4 (L) 03/01/2018 0440   HCT 29.0 (L) 03/01/2018 0440   PLT 704 (H) 03/01/2018 0440   MCV 76.5 (L) 03/01/2018 0440   MCH 24.8 (L) 03/01/2018 0440   MCHC 32.4 03/01/2018 0440   RDW 16.7 (H) 03/01/2018 0440     Assessment:  52 y.o. s/p Procedure(s): EXPLORATORY LAPAROTOMY, PERITONEAL BIOPSIES SALPINGO OOPHORECTOMY APPENDECTOMY: stable Pain:  Pain is well-controlled on PCA or oral medications.  Heme:  Anemia: stable; c/w intraoperative blood loss   GI:  Tolerating po: Yes  Slowly returning bowel function.  FEN: Electrolytes in range.  Prophylaxis: pharmacologic prophylaxis (with any of the following: enoxaparin (Lovenox) 40mg  SQ 2 hours prior to surgery then every day) and  intermittent pneumatic compression boots.  Plan: Encourage ambulation Discontinue IV fluids  The patient is to be discharged to home, likely tomorrow.   LOS: 3 days    Teresa Richardson 03/03/2018, 8:51 AM

## 2018-03-04 LAB — TYPE AND SCREEN
ABO/RH(D): A POS
ANTIBODY SCREEN: NEGATIVE
UNIT DIVISION: 0
Unit division: 0

## 2018-03-04 LAB — BPAM RBC
Blood Product Expiration Date: 201907112359
Blood Product Expiration Date: 201907112359
UNIT TYPE AND RH: 6200
UNIT TYPE AND RH: 6200

## 2018-03-04 MED ORDER — BISACODYL 10 MG RE SUPP
10.0000 mg | Freq: Every day | RECTAL | Status: DC | PRN
  Administered 2018-03-04: 10 mg via RECTAL
  Filled 2018-03-04: qty 1

## 2018-03-04 MED ORDER — ENOXAPARIN (LOVENOX) PATIENT EDUCATION KIT
PACK | Freq: Once | Status: AC
  Administered 2018-03-04: 11:00:00
  Filled 2018-03-04: qty 1

## 2018-03-04 MED ORDER — OXYCODONE HCL 5 MG PO TABS: 5.0000 mg | ORAL_TABLET | ORAL | 0 refills | Status: AC | PRN

## 2018-03-04 MED ORDER — ENOXAPARIN SODIUM 40 MG/0.4ML ~~LOC~~ SOLN: 40.0000 mg | SUBCUTANEOUS | 0 refills | Status: AC

## 2018-03-04 NOTE — Progress Notes (Signed)
Assessment unchanged. Pt verbalized understanding of dc instructions through teach back including follow up care, when to call the doctor, and medications to resume. Pt given Lovenox kit and verbalized understanding of how to administer injectable. Lovenox educaiton material reviewed with pt. Script x 2 given per MD. Discharged via wc to front entrance accompanied by NT.

## 2018-03-04 NOTE — Progress Notes (Signed)
4 Days Post-Op Procedure(s) (LRB): EXPLORATORY LAPAROTOMY, PERITONEAL BIOPSIES (N/A) SALPINGO OOPHORECTOMY (Bilateral) APPENDECTOMY (N/A)  Subjective: Patient reports  No BM or N/V  Objective: Vital signs in last 24 hours: Temp:  [98 F (36.7 C)-98.4 F (36.9 C)] 98 F (36.7 C) (06/29 0503) Pulse Rate:  [87-92] 89 (06/29 0503) Resp:  [16-18] 18 (06/29 0503) BP: (127-129)/(67-81) 129/81 (06/29 0503) SpO2:  [97 %-99 %] 97 % (06/29 0503) Last BM Date: 02/27/18  Intake/Output from previous day: 06/28 0701 - 06/29 0700 In: 480 [P.O.:480] Out: 2600 [Urine:2600]  Physical Examination: General: alert Resp: clear to auscultation bilaterally Cardio: regular rate and rhythm, S1, S2 normal, no murmur, click, rub or gallop GI: Softly distended--less distended, I: C/D/I Extremities: extremities normal, atraumatic, no cyanosis or edema and Homans sign is negative, no sign of DVT  Labs:     CBC    Assessment:  52 y.o. s/p Procedure(s): EXPLORATORY LAPAROTOMY, PERITONEAL BIOPSIES SALPINGO OOPHORECTOMY APPENDECTOMY: stable Pain:  Pain is well-controlled on PCA or oral medications.  GI:  Tolerating po: Yes  Slowly returning bowel function.  Prophylaxis: pharmacologic prophylaxis (with any of the following: enoxaparin (Lovenox) 40mg  SQ 2 hours prior to surgery then every day) and intermittent pneumatic compression boots.  Plan: Encourage ambulation Advance diet Dulcolax suppository Anticipate discharge home later today   LOS: 4 days    Teresa Richardson 03/04/2018, 10:23 AM

## 2018-03-05 NOTE — Discharge Summary (Signed)
Physician Discharge Summary  Patient ID: Teresa Richardson MRN: [id number] DOB/AGE: 54-Nov-1967 54 y.o.  Admit date: 02/28/2018 Discharge date: 03/05/2018  Admission Diagnoses: <principal problem not specified>  Discharge Diagnoses:  Active Problems:   Pelvic mass in female   Appendiceal tumor   Peritoneal metastases [facility])   Discharged Condition: stable  Hospital Course: On 02/28/2018, the patient underwent the following: Procedure(s): EXPLORATORY LAPAROTOMY, PERITONEAL BIOPSIES SALPINGO OOPHORECTOMY APPENDECTOMY.   The postoperative course was remarkable for a mild ileus that resolved with supportive measures.  Her electrolytes were in range   She was discharged to home on postoperative day 4 tolerating a regular diet, meeting all postoperative goals.  .  Consults: None  Significant Diagnostic Studies: None  Treatments: surgery: see above  Discharge Exam: Blood pressure 100/80, pulse 93, temperature 98.3 F (36.8 C), temperature source Oral, resp. rate 13, height 5' 3.5" (1.613 m), weight 159 lb (72.1 kg), last menstrual period 07/07/2010, SpO2 95 %. General appearance: alert Resp: clear to auscultation bilaterally Cardio: regular rate and rhythm GI: soft, non-tender; bowel sounds normal; no masses,  no organomegaly Extremities: extremities normal, atraumatic, no cyanosis or edema and Homans sign is negative, no sign of DVT Incision/Wound: CD/I  Disposition:   Discharge Instructions    Call MD for:  extreme fatigue   Complete by:  As directed    Call MD for:  persistant dizziness or light-headedness   Complete by:  As directed    Call MD for:  persistant nausea and vomiting   Complete by:  As directed    Call MD for:  redness, tenderness, or signs of infection (pain, swelling, redness, odor or green/yellow discharge around incision site)   Complete by:  As directed    Call MD for:  severe uncontrolled pain   Complete by:  As directed    Call MD for:  temperature >100.4    Complete by:  As directed    Diet general   Complete by:  As directed    Discharge wound care:   Complete by:  As directed    Keep clean and dry   Driving Restrictions   Complete by:  As directed    No driving for 1- 2 weeks   Increase activity slowly   Complete by:  As directed    Lifting restrictions   Complete by:  As directed    No lifting > 10 lbs for 6 weeks   May shower / Bathe   Complete by:  As directed    No tub baths for 6 weeks   Sexual Activity Restrictions   Complete by:  As directed    No intercourse for 6 - 8 weeks     Allergies as of 03/04/2018      Reactions   Eggs Or Egg-derived Products Other (See Comments)   Bumps and upset stomach   Penicillins Itching   Had rash 2- 3 days after taking amoxicillin she thinks Has patient had a PCN reaction causing immediate rash, facial/tongue/throat swelling, SOB or lightheadedness with hypotension: no Has patient had a PCN reaction causing severe rash involving mucus membranes or skin necrosis: no Has patient had a PCN reaction that required hospitalization: no Has patient had a PCN reaction occurring within the last 10 years: no If all of the above answers are "NO", then may proceed with Cephalosporin use.      Medication List    TAKE these medications   aspirin EC 81 MG tablet Take 81 mg by mouth  daily.   enoxaparin 40 MG/0.4ML injection Commonly known as:  LOVENOX Inject 0.4 mLs (40 mg total) into the skin daily.   oxyCODONE 5 MG immediate release tablet Commonly known as:  Oxy IR/ROXICODONE Take 1 tablet (5 mg total) by mouth every 4 (four) hours as needed for severe pain or breakthrough pain.   traMADol 50 MG tablet Commonly known as:  ULTRAM Take 50-100 mg by mouth every 6 (six) hours as needed.            Discharge Care Instructions  (From admission, onward)        Start     Ordered   03/04/18 0000  Discharge wound care:    Comments:  Keep clean and dry   03/04/18 1030        Signed: Lahoma Crocker 03/05/2018, 11:11 AM

## 2018-03-15 NOTE — Progress Notes (Signed)
GYNECOLOGIC ONCOLOGY - Post op Visit  HPI:  Teresa Richardson is a 52 y.o. year old initially seen in consultation for a large adnexal mass.    She underwent ExLap/BSO and appendectomy on 8/67/5449 without complications.  Intraoperatively she was found to have metastatic disease and an abnormal appendix. Frozen section was consistent with possible GI primary. Her postoperative course was uncomplicated thus far.  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  She had residual disease and has been referred to [facility], Dr. Pecolia Ades for Surg Onc recommendations re: possible HIPEC. [facility] has told her they are sending her financial aid paperwork, but she has not been given an appointment  Today we are seeing her for her first postop check and to discuss her pathology results and ongoing plan.  Since discharge from the hospital, she is feeling well.  She is sitting up more now that the large mass is gone and so having some incisional pain. Pain is controlled with minimal PO medication.   Physical Exam: Vitals:   03/17/18 1026  BP: (!) 94/51  Pulse: 82  Resp: 20  Temp: 98.1 F (36.7 C)  SpO2: 100%     General :  Well developed, 52 y.o., female in no apparent distress HEENT:  Normocephalic/atraumatic, symmetric, EOMI, eyelids normal Neck:   No visible masses.  Respiratory:  Respirations unlabored, no use of accessory muscles CV:   Deferred Breast:  Deferred Musculoskeletal: Normal muscle strength. Abdomen:  Wound is CDI.  Soft, NTND.  Extremities:  No visible edema or deformities Skin:   Normal inspection Neuro/Psych:  No focal motor deficit, no abnormal mental status. Normal gait. Normal affect. Alert and oriented to person, place, and time     Assessment:   S/p ExLap BSO/Appy with metastatic appendiceal CA   Plan: 1. Pathology reports reviewed with her  and she was given copies 2. Treatment counseling - Referred to Dr. Clovis Riley at [facility]. Awaiting date  for consult. I have asked Melissa to put a call in to get me Dr. Georgina Snell contact so I can discuss the case. I worry she will have difficulty navigating the system. 3. She was given the opportunity to ask questions, which were answered to her satisfaction, and she is agreement with the above mentioned plan of care. 4. Activity restrictions re-reviewed. 5. Return to clinic ~ one month  Teresa Caprice, MD  03/17/2018, 10:54 AM   Cc: Pecolia Ades, MD Patient, No Pcp Per

## 2018-03-17 ENCOUNTER — Inpatient Hospital Stay: Payer: MEDICAID | Attending: Obstetrics | Admitting: Obstetrics

## 2018-03-17 ENCOUNTER — Encounter: Payer: Self-pay | Admitting: Obstetrics

## 2018-03-17 VITALS — BP 94/51 | HR 82 | Temp 98.1°F | Resp 20 | Ht 63.0 in | Wt 118.9 lb

## 2018-03-17 DIAGNOSIS — C779 Secondary and unspecified malignant neoplasm of lymph node, unspecified: Secondary | ICD-10-CM

## 2018-03-17 DIAGNOSIS — Z90722 Acquired absence of ovaries, bilateral: Secondary | ICD-10-CM | POA: Insufficient documentation

## 2018-03-17 DIAGNOSIS — C181 Malignant neoplasm of appendix: Secondary | ICD-10-CM | POA: Insufficient documentation

## 2018-03-17 DIAGNOSIS — R1900 Intra-abdominal and pelvic swelling, mass and lump, unspecified site: Secondary | ICD-10-CM

## 2018-03-17 DIAGNOSIS — C799 Secondary malignant neoplasm of unspecified site: Secondary | ICD-10-CM | POA: Insufficient documentation

## 2018-03-17 NOTE — Patient Instructions (Signed)
1. RTC one month 2. We will reach out to [facility] to see if we can get your appointment arranged.

## 2018-03-21 ENCOUNTER — Telehealth: Payer: Self-pay | Admitting: Oncology

## 2018-03-21 NOTE — Telephone Encounter (Signed)
Called Dr. Georgina Snell office at [facility] and asked if patient has an appointment scheduled.  They said patient does not have insurance.  She is speaking with financial counseling to work out a plan.

## 2018-03-22 ENCOUNTER — Telehealth: Payer: Self-pay | Admitting: *Deleted

## 2018-03-22 ENCOUNTER — Other Ambulatory Visit: Payer: Self-pay | Admitting: Obstetrics

## 2018-03-22 NOTE — Telephone Encounter (Signed)
Patient called and stated "I am having trouble getting an appt with [facility] like Dr.Phelps wanted me too. I have been working with [facility] since I have no insurance, been filling out paperwork and sending it back. They say I still have some paperwork missing but will starting working on an appt for me. But the appt will not be for more than 10 days they said. They also told me they could see me if I gave a $200 deposit, that is crazy. Can Dr. Gerarda Fraction or Lenna Sciara help get me in to [facility] any sooner, please."   Message forwarded to [facility] APP

## 2018-03-29 ENCOUNTER — Telehealth: Payer: Self-pay | Admitting: Oncology

## 2018-03-29 NOTE — Telephone Encounter (Signed)
Called Teresa Richardson to see if Dr. Georgina Snell office had called her to schedule an appointment yet.  She said she still does not have an appointment.  She said she has turned in all the needed paperwork for financial assistance and was told it may be 10 days - 2 weeks before it goes through.  She is getting frustrated and said that [facility] "doesn't seem to be patient centered." Asked if she would like me to call to check on the status and she said yes.  She also said that if she can't get an appointment before 04/05/18, she would like to go somewhere else.  Called Dr. Georgina Snell office and spoke to [place].  Advised her that patient has been waiting for an appointment since 03/03/18 and we are concerned that is taking so long. She said she is working on getting patient charity care.  She is going to speak to her manager and would like to resolve this today and schedule patient for an appointment.  She will call us back as soon as she can.

## 2018-03-30 NOTE — Telephone Encounter (Signed)
Called Teresa Richardson and let her know that Dr. Gerarda Fraction is recommending waiting another week to see if she is scheduled with Dr. Clovis Riley.  Advised her that I will keep checking on the status.  She verbalized agreement.

## 2018-03-31 ENCOUNTER — Telehealth: Payer: Self-pay | Admitting: Oncology

## 2018-03-31 NOTE — Telephone Encounter (Signed)
Lannon left a message last night asking about disability.  Called her back and she said she received a letter from [facility] denying her coverage and referring her to [employer] and SS disability.  She said she called ss disability yesterday and they said they needed information from [place] before it can be processed.  Called them at [phone] and spoke to [facility].  She will fax over a form to be filled out.  Received request for information from [employer]ability [employer] with signed release of information form.  Faxed back requested information to [phone].  Also called [facility] and talked with Jacqlyn Larsen at Dr. Georgina Snell office.  They have not had a reponse yet for charity care.  She will let us know when it comes through.  Called Kamiya back and let her know the disability information has been sent and that we are still waiting for approval from [facility] for the charity care.  Advised I will check with her on Monday.

## 2018-04-05 ENCOUNTER — Telehealth: Payer: Self-pay | Admitting: Oncology

## 2018-04-05 ENCOUNTER — Encounter: Payer: Self-pay | Admitting: Oncology

## 2018-04-05 NOTE — Telephone Encounter (Signed)
Called [place] back and let her know the status at [facility] her that we have started a referral to [facility].  She verbalized understanding.

## 2018-04-05 NOTE — Telephone Encounter (Signed)
Called [place] and asked if she has heard from [facility] yet.  She said she has not heard anything.  [facility] with Dr. Georgina Snell office to check on status of patient's financial assistance.  She said [place] has been denied so far, due to the cost of the care she will need.  Her case is being reviewed again by "the big wigs" but there is no decision yet. She is not able to schedule patient until she is approved for financial assistance.

## 2018-04-05 NOTE — Progress Notes (Addendum)
Referral faxed to [facility] - Dr. Laddie Aquas per Joylene John, NP.

## 2018-04-07 ENCOUNTER — Telehealth: Payer: Self-pay | Admitting: Oncology

## 2018-04-07 ENCOUNTER — Encounter: Payer: Self-pay | Admitting: Oncology

## 2018-04-07 NOTE — Telephone Encounter (Signed)
Teresa Richardson left a message saying that she has an appointment at [facility] on Wednesday, 04/12/18 at 8 am.

## 2018-04-12 ENCOUNTER — Telehealth: Payer: Self-pay | Admitting: *Deleted

## 2018-04-12 NOTE — Telephone Encounter (Signed)
Cancelled patient's appointment due to transfer of care from Dr. Gerarda Fraction to the [facility].  Patient was very appreciative and verbalized understanding.

## 2018-04-14 ENCOUNTER — Ambulatory Visit: Payer: Self-pay | Admitting: Obstetrics

## 2019-10-03 ENCOUNTER — Encounter: Payer: Self-pay | Admitting: Orthopaedic Surgery

## 2019-10-03 ENCOUNTER — Ambulatory Visit (INDEPENDENT_AMBULATORY_CARE_PROVIDER_SITE_OTHER): Payer: Medicaid Other | Admitting: Orthopaedic Surgery

## 2019-10-03 VITALS — Ht 63.0 in | Wt 172.0 lb

## 2019-10-03 DIAGNOSIS — G5603 Carpal tunnel syndrome, bilateral upper limbs: Secondary | ICD-10-CM

## 2019-10-03 DIAGNOSIS — M79641 Pain in right hand: Secondary | ICD-10-CM | POA: Diagnosis not present

## 2019-10-03 DIAGNOSIS — M79642 Pain in left hand: Secondary | ICD-10-CM

## 2019-10-03 NOTE — Progress Notes (Signed)
Office Visit Note   Patient: Teresa Richardson           Date of Birth: 23-Feb-1966           MRN: [id number] Visit Date: 10/03/2019              Requested by: No referring provider defined for this encounter. PCP: Patient, No Pcp Per   Assessment & Plan: Visit Diagnoses:  1. Bilateral hand pain   2. Carpal tunnel syndrome, bilateral     Plan: [name] has evidence of residual symptoms relative to her carpal tunnel syndrome diagnosis.  She has had prior surgery within the past year to both wrists performed through [facility] in [place].  She is left-hand dominant and still having some numbness and tingling predominately in her left hand and a sensation of "fuzziness".  She now lives in [place] and is concerned about her hands.  She presently is not working.  I will repeat EMGs and nerve conduction studies.  I do not have the prior study for review  Follow-Up Instructions: Return After EMGs and nerve conduction studies.   Orders:  Orders Placed This Encounter  Procedures  . Ambulatory referral to Physical Medicine Rehab   No orders of the defined types were placed in this encounter.     Procedures: No procedures performed   Clinical Data: No additional findings.   Subjective: Chief Complaint  Patient presents with  . Right Hand - Pain  . Left Hand - Pain  Patient presents today for bilateral hand pain. She was told that she had bilateral carpal tunnel last year after having EMGs done. She had surgery on both with [facility]. She said that both still hurt. The left side is worse than the right. She has numbness in her fingers on both hands. She does have braces and wears them occasionally at night. She takes Tylenol or Advil as needed. She is left hand dominant. She has a history of appendix cancer and states that she sweats at night since then, so because of that she does not wear her braces at night as often.  No chemotherapy for her appendix  cancer.  Has had prior appendectomy. Surgery for the left wrist was in March 2020 and on the right in May 2020.  Still having numbness in both hands particularly on the left dominant side.  She has a sensation of throbbing and "fuzziness".  Presently not working  HPI  Review of Systems   Objective: Vital Signs: Ht 5\' 3"  (1.6 m)   Wt 172 lb (78 kg)   LMP 07/07/2010   BMI 30.47 kg/m   Physical Exam Constitutional:      Appearance: She is well-developed.  Eyes:     Pupils: Pupils are equal, round, and reactive to light.  Pulmonary:     Effort: Pulmonary effort is normal.  Skin:    General: Skin is warm and dry.  Neurological:     Mental Status: She is alert and oriented to person, place, and time.  Psychiatric:        Behavior: Behavior normal.     Ortho Exam awake alert and oriented x3.  Comfortable sitting.  Negative Phalen's and Tinel's on both hands.  Good opposition of thumb to little finger bilaterally with good strength.  Carpal tunnel incisions have healed without a problem.  Neurologically intact.  The left hand she had a prior infection with well-healed palmar incisions.  Has subjective tingling in the radial  3 digits of her left hand.  No neck discomfort.  Painless range of motion both shoulders  Specialty Comments:  No specialty comments available.  Imaging: No results found.   PMFS History: Patient Active Problem List   Diagnosis Date Noted  . Carpal tunnel syndrome, bilateral 10/03/2019  . Pelvic mass in female 02/28/2018  . Appendiceal tumor   . Peritoneal metastases ([place])   . Pelvic mass 02/17/2018   Past Medical History:  Diagnosis Date  . Arthritis   . Constipation   . MRSA (methicillin resistant staph aureus) culture positive   . Myofascial pain syndrome    Myofascial pain disorder    Family History  Problem Relation Age of Onset  . Breast cancer Mother 60  . Breast cancer Sister 47  . Colon cancer Maternal Uncle   . Cancer Maternal  Grandmother     Past Surgical History:  Procedure Laterality Date  . APPENDECTOMY N/A 02/28/2018   Procedure: APPENDECTOMY;  Surgeon: [name], MD;  Location: [facility];  Service: Gynecology;  Laterality: N/A;  . CESAREAN SECTION     x 2  . HAND DEBRIDEMENT Left 09/2011   infected after splinter with MRSA  . LAPAROTOMY N/A 02/28/2018   Procedure: EXPLORATORY LAPAROTOMY, PERITONEAL BIOPSIES;  Surgeon: [name], MD;  Location: [facility];  Service: Gynecology;  Laterality: N/A;  . SALPINGOOPHORECTOMY Bilateral 02/28/2018   Procedure: SALPINGO OOPHORECTOMY;  Surgeon: [name], MD;  Location: [facility];  Service: Gynecology;  Laterality: Bilateral;  . TRIGGER FINGER RELEASE Right 2015  . TUBAL LIGATION  2005   Social History   Occupational History  . Not on file  Tobacco Use  . Smoking status: Never Smoker  . Smokeless tobacco: Never Used  Substance and Sexual Activity  . Alcohol use: Yes    Comment: Ocassionally  . Drug use: Never  . Sexual activity: Not Currently

## 2019-10-16 ENCOUNTER — Ambulatory Visit (INDEPENDENT_AMBULATORY_CARE_PROVIDER_SITE_OTHER): Payer: Medicaid Other | Admitting: Physical Medicine and Rehabilitation

## 2019-10-16 ENCOUNTER — Other Ambulatory Visit: Payer: Self-pay

## 2019-10-16 DIAGNOSIS — R202 Paresthesia of skin: Secondary | ICD-10-CM | POA: Diagnosis not present

## 2019-10-16 NOTE — Progress Notes (Signed)
 .  Numeric Pain Rating Scale and Functional Assessment Average Pain 0   In the last MONTH (on 0-10 scale) has pain interfered with the following?  1. General activity like being  able to carry out your everyday physical activities such as walking, climbing stairs, carrying groceries, or moving a chair?  Rating(7)    

## 2019-10-17 NOTE — Progress Notes (Signed)
Teresa Richardson - 54 y.o. female MRN [id number]  Date of birth: 06/11/1966  Office Visit Note: Visit Date: 10/16/2019 PCP: Patient, No Pcp Per Referred by: Garald Balding, MD  Subjective: Chief Complaint  Patient presents with  . Right Hand - Numbness, Tingling  . Left Hand - Numbness, Tingling   HPI: Teresa Richardson is a 54 y.o. female who comes in today For electrodiagnostic study of both upper limbs at the request of Dr. Joni Fears.  Patient is left-hand dominant and reports 6 years of chronic pain numbness and tingling in both hands left more than right.  The left hand she reports numbness in the thumb index and middle finger and on the right hand she reports numbness in the fourth and fifth digits.  She reports history of prior carpal tunnel release that was completed last year at [facility] orthopedic and sports medicine.  She reports having had electrodiagnostic study prior to that surgery but we do not have those to review.  She cannot really remember exactly where the electrodiagnostic study was performed.  She was told that it was severe.  She reports initial relief with the surgery and then symptoms began to return.  She denies any frank radicular symptoms.  She continues to wear bracing when she can then she does use the medication.  She reports worsening with grabbing and moving her hand.  ROS Otherwise per HPI.  Assessment & Plan: Visit Diagnoses:  1. Paresthesia of skin     Plan: Impression: The above electrodiagnostic study is ABNORMAL and reveals evidence of a mild right median nerve entrapment at the wrist (carpal tunnel syndrome) affecting sensory components.  This does not really fit with her pattern of paresthesia.  There is no significant electrodiagnostic evidence of any other focal nerve entrapment, brachial plexopathy, cervical radiculopathy or generalized peripheral neuropathy.    **This electrodiagnostic study cannot rule out small fiber polyneuropathy and  dysesthesias from central pain syndromes such as stroke or central pain sensitization syndromes such as fibromyalgia.  Myotomal referral pain from trigger points is also not excluded.  Recommendations: 1.  Follow-up with referring physician. 2.  Continue current management of symptoms.  Meds & Orders: No orders of the defined types were placed in this encounter.   Orders Placed This Encounter  Procedures  . NCV with EMG (electromyography)    Follow-up: Return in about 2 weeks (around 10/30/2019) for Joni Fears, MD.   Procedures: No procedures performed  EMG & NCV Findings: Evaluation of the right median (across palm) sensory nerve showed prolonged distal peak latency (Wrist, 3.7 ms).  All remaining nerves (as indicated in the following tables) were within normal limits.  All left vs. right side differences were within normal limits.    All examined muscles (as indicated in the following table) showed no evidence of electrical instability.    Impression: The above electrodiagnostic study is ABNORMAL and reveals evidence of a mild right median nerve entrapment at the wrist (carpal tunnel syndrome) affecting sensory components.  This does not really fit with her pattern of paresthesia.  There is no significant electrodiagnostic evidence of any other focal nerve entrapment, brachial plexopathy, cervical radiculopathy or generalized peripheral neuropathy.    **This electrodiagnostic study cannot rule out small fiber polyneuropathy and dysesthesias from central pain syndromes such as stroke or central pain sensitization syndromes such as fibromyalgia.  Myotomal referral pain from trigger points is also not excluded.  Recommendations: 1.  Follow-up with referring physician. 2.  Continue current  management of symptoms.  ___________________________ Laurence Spates FAAPMR Board Certified, American Board of Physical Medicine and Rehabilitation    Nerve Conduction Studies Anti Sensory  Summary Table   Stim Site NR Peak (ms) Norm Peak (ms) P-T Amp (V) Norm P-T Amp Site1 Site2 Delta-P (ms) Dist (cm) Vel (m/s) Norm Vel (m/s)  Left Median Acr Palm Anti Sensory (2nd Digit)  30.9C  Wrist    3.4 <3.6 25.6 >10 Wrist Palm 1.5 0.0    Palm    1.9 <2.0 18.6         Right Median Acr Palm Anti Sensory (2nd Digit)  29.9C  Wrist    *3.7 <3.6 17.4 >10 Wrist Palm 1.7 0.0    Palm    2.0 <2.0 26.7         Left Radial Anti Sensory (Base 1st Digit)  30.9C  Wrist    2.1 <3.1 37.6  Wrist Base 1st Digit 2.1 0.0    Right Radial Anti Sensory (Base 1st Digit)  30.8C  Wrist    2.3 <3.1 23.6  Wrist Base 1st Digit 2.3 0.0    Left Ulnar Anti Sensory (5th Digit)  31.2C  Wrist    3.3 <3.7 24.9 >15.0 Wrist 5th Digit 3.3 14.0 42 >38  Right Ulnar Anti Sensory (5th Digit)  30.5C  Wrist    3.3 <3.7 31.1 >15.0 Wrist 5th Digit 3.3 14.0 42 >38   Motor Summary Table   Stim Site NR Onset (ms) Norm Onset (ms) O-P Amp (mV) Norm O-P Amp Site1 Site2 Delta-0 (ms) Dist (cm) Vel (m/s) Norm Vel (m/s)  Left Median Motor (Abd Poll Brev)  31C  Wrist    3.4 <4.2 8.4 >5 Elbow Wrist 3.6 19.0 53 >50  Elbow    7.0  8.1         Right Median Motor (Abd Poll Brev)  31C  Wrist    3.8 <4.2 7.0 >5 Elbow Wrist 3.8 19.0 50 >50  Elbow    7.6  6.7         Left Ulnar Motor (Abd Dig Min)  31C  Wrist    2.9 <4.2 9.3 >3 B Elbow Wrist 2.8 17.5 62 >53  B Elbow    5.7  9.2  A Elbow B Elbow 0.9 10.0 111 >53  A Elbow    6.6  8.6         Right Ulnar Motor (Abd Dig Min)  31.3C  Wrist    2.8 <4.2 8.2 >3 B Elbow Wrist 2.9 17.5 60 >53  B Elbow    5.7  9.0  A Elbow B Elbow 1.0 10.0 100 >53  A Elbow    6.7  8.7          EMG   Side Muscle Nerve Root Ins Act Fibs Psw Amp Dur Poly Recrt Int Fraser Din Comment  Left Abd Poll Brev Median C8-T1 Nml Nml Nml Nml Nml 0 Nml Nml   Left 1stDorInt Ulnar C8-T1 Nml Nml Nml Nml Nml 0 Nml Nml   Left PronatorTeres Median C6-7 Nml Nml Nml Nml Nml 0 Nml Nml   Left Biceps Musculocut C5-6 Nml Nml Nml Nml Nml  0 Nml Nml   Left Deltoid Axillary C5-6 Nml Nml Nml Nml Nml 0 Nml Nml     Nerve Conduction Studies Anti Sensory Left/Right Comparison   Stim Site L Lat (ms) R Lat (ms) L-R Lat (ms) L Amp (V) R Amp (V) L-R Amp (%) Site1 Site2 L Vel (m/s) R  Vel (m/s) L-R Vel (m/s)  Median Acr Palm Anti Sensory (2nd Digit)  30.9C  Wrist 3.4 *3.7 0.3 25.6 17.4 32.0 Wrist Palm     Palm 1.9 2.0 0.1 18.6 26.7 30.3       Radial Anti Sensory (Base 1st Digit)  30.9C  Wrist 2.1 2.3 0.2 37.6 23.6 37.2 Wrist Base 1st Digit     Ulnar Anti Sensory (5th Digit)  31.2C  Wrist 3.3 3.3 0.0 24.9 31.1 19.9 Wrist 5th Digit 42 42 0   Motor Left/Right Comparison   Stim Site L Lat (ms) R Lat (ms) L-R Lat (ms) L Amp (mV) R Amp (mV) L-R Amp (%) Site1 Site2 L Vel (m/s) R Vel (m/s) L-R Vel (m/s)  Median Motor (Abd Poll Brev)  31C  Wrist 3.4 3.8 0.4 8.4 7.0 16.7 Elbow Wrist 53 50 3  Elbow 7.0 7.6 0.6 8.1 6.7 17.3       Ulnar Motor (Abd Dig Min)  31C  Wrist 2.9 2.8 0.1 9.3 8.2 11.8 B Elbow Wrist 62 60 2  B Elbow 5.7 5.7 0.0 9.2 9.0 2.2 A Elbow B Elbow 111 100 11  A Elbow 6.6 6.7 0.1 8.6 8.7 1.1          Waveforms:                      Clinical History: No specialty comments available.   She reports that she has never smoked. She has never used smokeless tobacco. No results for input(s): HGBA1C, LABURIC in the last 8760 hours.  Objective:  VS:  HT:    WT:   BMI:     BP:   HR: bpm  TEMP: ( )  RESP:  Physical Exam Musculoskeletal:        General: No swelling, tenderness or deformity.     Comments: Inspection reveals well-healed bilateral carpal tunnel release scars but no atrophy of the bilateral APB or FDI or hand intrinsics. There is no swelling, color changes, allodynia or dystrophic changes. There is 5 out of 5 strength in the bilateral wrist extension, finger abduction and long finger flexion. There is intact sensation to light touch in all dermatomal and peripheral nerve distributions.  There is a  negative Hoffmann's test bilaterally.  Skin:    General: Skin is warm and dry.     Findings: No erythema or rash.  Neurological:     General: No focal deficit present.     Mental Status: She is alert and oriented to person, place, and time.     Motor: No weakness or abnormal muscle tone.     Coordination: Coordination normal.  Psychiatric:        Mood and Affect: Mood normal.        Behavior: Behavior normal.     Ortho Exam Imaging: No results found.  Past Medical/Family/Surgical/Social History: Medications & Allergies reviewed per EMR, new medications updated. Patient Active Problem List   Diagnosis Date Noted  . Carpal tunnel syndrome, bilateral 10/03/2019  . Pelvic mass in female 02/28/2018  . Appendiceal tumor   . 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 Pelvic mass 02/17/2018   Past Medical History:  Diagnosis Date  . Arthritis   . Constipation   . MRSA (methicillin resistant staph aureus) culture positive   . Myofascial pain syndrome    Myofascial pain disorder   Family History  Problem Relation Age of Onset  . Breast cancer Mother 5  . Breast cancer Sister 12  .  Colon cancer Maternal Uncle   . Cancer Maternal Grandmother    Past Surgical History:  Procedure Laterality Date  . APPENDECTOMY N/A 02/28/2018   Procedure: APPENDECTOMY;  Surgeon: [name], MD;  Location: [facility];  Service: Gynecology;  Laterality: N/A;  . CESAREAN SECTION     x 2  . HAND DEBRIDEMENT Left 09/2011   infected after splinter with MRSA  . LAPAROTOMY N/A 02/28/2018   Procedure: EXPLORATORY LAPAROTOMY, PERITONEAL BIOPSIES;  Surgeon: [name], MD;  Location: [facility];  Service: Gynecology;  Laterality: N/A;  . SALPINGOOPHORECTOMY Bilateral 02/28/2018   Procedure: SALPINGO OOPHORECTOMY;  Surgeon: [name], MD;  Location: [facility];  Service: Gynecology;  Laterality: Bilateral;  . TRIGGER FINGER RELEASE Right 2015  . TUBAL LIGATION  2005   Social History   Occupational History   . Not on file  Tobacco Use  . Smoking status: Never Smoker  . Smokeless tobacco: Never Used  Substance and Sexual Activity  . Alcohol use: Yes    Comment: Ocassionally  . Drug use: Never  . Sexual activity: Not Currently

## 2019-10-18 NOTE — Procedures (Signed)
EMG & NCV Findings: Evaluation of the right median (across palm) sensory nerve showed prolonged distal peak latency (Wrist, 3.7 ms).  All remaining nerves (as indicated in the following tables) were within normal limits.  All left vs. right side differences were within normal limits.    All examined muscles (as indicated in the following table) showed no evidence of electrical instability.    Impression: The above electrodiagnostic study is ABNORMAL and reveals evidence of a mild right median nerve entrapment at the wrist (carpal tunnel syndrome) affecting sensory components.  This does not really fit with her pattern of paresthesia.  There is no significant electrodiagnostic evidence of any other focal nerve entrapment, brachial plexopathy, cervical radiculopathy or generalized peripheral neuropathy.    **This electrodiagnostic study cannot rule out small fiber polyneuropathy and dysesthesias from central pain syndromes such as stroke or central pain sensitization syndromes such as fibromyalgia.  Myotomal referral pain from trigger points is also not excluded.  Recommendations: 1.  Follow-up with referring physician. 2.  Continue current management of symptoms.  ___________________________ Laurence Spates FAAPMR Board Certified, American Board of Physical Medicine and Rehabilitation    Nerve Conduction Studies Anti Sensory Summary Table   Stim Site NR Peak (ms) Norm Peak (ms) P-T Amp (V) Norm P-T Amp Site1 Site2 Delta-P (ms) Dist (cm) Vel (m/s) Norm Vel (m/s)  Left Median Acr Palm Anti Sensory (2nd Digit)  30.9C  Wrist    3.4 <3.6 25.6 >10 Wrist Palm 1.5 0.0    Palm    1.9 <2.0 18.6         Right Median Acr Palm Anti Sensory (2nd Digit)  29.9C  Wrist    *3.7 <3.6 17.4 >10 Wrist Palm 1.7 0.0    Palm    2.0 <2.0 26.7         Left Radial Anti Sensory (Base 1st Digit)  30.9C  Wrist    2.1 <3.1 37.6  Wrist Base 1st Digit 2.1 0.0    Right Radial Anti Sensory (Base 1st Digit)  30.8C    Wrist    2.3 <3.1 23.6  Wrist Base 1st Digit 2.3 0.0    Left Ulnar Anti Sensory (5th Digit)  31.2C  Wrist    3.3 <3.7 24.9 >15.0 Wrist 5th Digit 3.3 14.0 42 >38  Right Ulnar Anti Sensory (5th Digit)  30.5C  Wrist    3.3 <3.7 31.1 >15.0 Wrist 5th Digit 3.3 14.0 42 >38   Motor Summary Table   Stim Site NR Onset (ms) Norm Onset (ms) O-P Amp (mV) Norm O-P Amp Site1 Site2 Delta-0 (ms) Dist (cm) Vel (m/s) Norm Vel (m/s)  Left Median Motor (Abd Poll Brev)  31C  Wrist    3.4 <4.2 8.4 >5 Elbow Wrist 3.6 19.0 53 >50  Elbow    7.0  8.1         Right Median Motor (Abd Poll Brev)  31C  Wrist    3.8 <4.2 7.0 >5 Elbow Wrist 3.8 19.0 50 >50  Elbow    7.6  6.7         Left Ulnar Motor (Abd Dig Min)  31C  Wrist    2.9 <4.2 9.3 >3 B Elbow Wrist 2.8 17.5 62 >53  B Elbow    5.7  9.2  A Elbow B Elbow 0.9 10.0 111 >53  A Elbow    6.6  8.6         Right Ulnar Motor (Abd Dig Min)  31.3C  Wrist  2.8 <4.2 8.2 >3 B Elbow Wrist 2.9 17.5 60 >53  B Elbow    5.7  9.0  A Elbow B Elbow 1.0 10.0 100 >53  A Elbow    6.7  8.7          EMG   Side Muscle Nerve Root Ins Act Fibs Psw Amp Dur Poly Recrt Int Fraser Din Comment  Left Abd Poll Brev Median C8-T1 Nml Nml Nml Nml Nml 0 Nml Nml   Left 1stDorInt Ulnar C8-T1 Nml Nml Nml Nml Nml 0 Nml Nml   Left PronatorTeres Median C6-7 Nml Nml Nml Nml Nml 0 Nml Nml   Left Biceps Musculocut C5-6 Nml Nml Nml Nml Nml 0 Nml Nml   Left Deltoid Axillary C5-6 Nml Nml Nml Nml Nml 0 Nml Nml     Nerve Conduction Studies Anti Sensory Left/Right Comparison   Stim Site L Lat (ms) R Lat (ms) L-R Lat (ms) L Amp (V) R Amp (V) L-R Amp (%) Site1 Site2 L Vel (m/s) R Vel (m/s) L-R Vel (m/s)  Median Acr Palm Anti Sensory (2nd Digit)  30.9C  Wrist 3.4 *3.7 0.3 25.6 17.4 32.0 Wrist Palm     Palm 1.9 2.0 0.1 18.6 26.7 30.3       Radial Anti Sensory (Base 1st Digit)  30.9C  Wrist 2.1 2.3 0.2 37.6 23.6 37.2 Wrist Base 1st Digit     Ulnar Anti Sensory (5th Digit)  31.2C  Wrist 3.3 3.3 0.0  24.9 31.1 19.9 Wrist 5th Digit 42 42 0   Motor Left/Right Comparison   Stim Site L Lat (ms) R Lat (ms) L-R Lat (ms) L Amp (mV) R Amp (mV) L-R Amp (%) Site1 Site2 L Vel (m/s) R Vel (m/s) L-R Vel (m/s)  Median Motor (Abd Poll Brev)  31C  Wrist 3.4 3.8 0.4 8.4 7.0 16.7 Elbow Wrist 53 50 3  Elbow 7.0 7.6 0.6 8.1 6.7 17.3       Ulnar Motor (Abd Dig Min)  31C  Wrist 2.9 2.8 0.1 9.3 8.2 11.8 B Elbow Wrist 62 60 2  B Elbow 5.7 5.7 0.0 9.2 9.0 2.2 A Elbow B Elbow 111 100 11  A Elbow 6.6 6.7 0.1 8.6 8.7 1.1          Waveforms:

## 2019-10-23 ENCOUNTER — Ambulatory Visit: Payer: Medicaid Other | Admitting: Orthopaedic Surgery

## 2019-11-14 ENCOUNTER — Ambulatory Visit (INDEPENDENT_AMBULATORY_CARE_PROVIDER_SITE_OTHER): Payer: Medicaid Other | Admitting: Orthopaedic Surgery

## 2019-11-14 ENCOUNTER — Encounter: Payer: Self-pay | Admitting: Orthopaedic Surgery

## 2019-11-14 VITALS — Ht 63.0 in | Wt 175.0 lb

## 2019-11-14 DIAGNOSIS — G5603 Carpal tunnel syndrome, bilateral upper limbs: Secondary | ICD-10-CM

## 2019-11-14 NOTE — Progress Notes (Signed)
Office Visit Note   Patient: Teresa Richardson           Date of Birth: 1965/11/12           MRN: [id number] Visit Date: 11/14/2019              Requested by: No referring provider defined for this encounter. PCP: Patient, No Pcp Per   Assessment & Plan: Visit Diagnoses:  1. Carpal tunnel syndrome, bilateral     Plan: Ms. Jerabek within the past year has had bilateral carpal tunnel surgery performed in [facility].  She was having some residual symptoms and I ordered repeat EMGs and nerve conduction studies.  These revealed mild residual right carpal tunnel.  She is left-hand dominant and there was no evidence of any residual neurologic problems on the nerve studies.  We were able to retrieve her EMG reports before her carpal tunnel surgery and she had mild to moderate compression of the nerves at both wrists.  She does have some residual issues with her left upper extremity which I could not explain based on her exam or her nerve studies.  I think it is worthwhile for her to return to the care of her primary care physician.  He may want to consider referral to the neurologist.  She has a number of comorbidities that could be playing a part in her pain complex.  She does have a history of myofascial pain syndrome with possible fibromyalgia.  She also has anxiety.  She is on medicines for both.  Follow-Up Instructions: Return if symptoms worsen or fail to improve.   Orders:  No orders of the defined types were placed in this encounter.  No orders of the defined types were placed in this encounter.     Procedures: No procedures performed   Clinical Data: No additional findings.   Subjective: Chief Complaint  Patient presents with  . Right Hand - Follow-up    EMG results  . Left Hand - Follow-up  Patient presents today for follow up on her hands. She had an EMG study with Dr.Newton on 10/16/2019. She is here today to discuss her results. She is taking Advil, Tylenol, and Lyrica  for pain. She has tried all kinds of braces to wear, but states that they cause her to sweat or cause discomfort around her thumb.  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  She is being followed routinely.  She does have history of anxiety and possibly myofascial pain syndrome my fibromyalgia.  She is on pregabalin and an antianxiety medicine. Has some residual issues with her left upper extremity with a sensation of "fuzziness".  Sometimes she feels like the arm compromises her activities.  She will take Tylenol and Advil as needed.  She has tried wearing braces on her arms but sometimes they cause her to sweat her "digging into my thumb".  HPI  Review of Systems   Objective: Vital Signs: Ht 5\' 3"  (1.6 m)   Wt 175 lb (79.4 kg)   LMP 07/07/2010   BMI 31.00 kg/m   Physical Exam Constitutional:      Appearance: She is well-developed.  Eyes:     Pupils: Pupils are equal, round, and reactive to light.  Pulmonary:     Effort: Pulmonary effort is normal.  Skin:    General: Skin is warm and dry.  Neurological:     Mental Status: She is  alert and oriented to person, place, and time.  Psychiatric:        Behavior: Behavior normal.     Ortho Exam awake alert and oriented x3.  She seemed comfortable sitting.  Did not have any issues with her right hand with normal opposition to thumb to little finger.  No swelling of the digits.  She can make a fist with release.  Painless range of motion elbow and shoulder.  She could not reproduce any pain in either upper extremity with motion of her cervical spine.  Minimally positive Tinel's left wrist.  Good grip and release.  Normal opposition of thumb to little finger.  No swelling of her fingers.  No pain with range of motion of the left elbow in flexion, extension, supination or pronation.  No localized areas of tenderness.  A little soreness with motion of her  shoulder but negative impingement and empty can testing.  Skin intact.  Painless range of motion of the cervical spine without referred pain with motion  Specialty Comments:  No specialty comments available.  Imaging: No results found.   PMFS History: Patient Active Problem List   Diagnosis Date Noted  . Carpal tunnel syndrome, bilateral 10/03/2019  . Pelvic mass in female 02/28/2018  . Appendiceal tumor   . 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 Pelvic mass 02/17/2018   Past Medical History:  Diagnosis Date  . Arthritis   . Constipation   . MRSA (methicillin resistant staph aureus) culture positive   . Myofascial pain syndrome    Myofascial pain disorder    Family History  Problem Relation Age of Onset  . Breast cancer Mother 43  . Breast cancer Sister 66  . Colon cancer Maternal Uncle   . Cancer Maternal Grandmother     Past Surgical History:  Procedure Laterality Date  . APPENDECTOMY N/A 02/28/2018   Procedure: APPENDECTOMY;  Surgeon: [name], MD;  Location: [facility];  Service: Gynecology;  Laterality: N/A;  . CESAREAN SECTION     x 2  . HAND DEBRIDEMENT Left 09/2011   infected after splinter with MRSA  . LAPAROTOMY N/A 02/28/2018   Procedure: EXPLORATORY LAPAROTOMY, PERITONEAL BIOPSIES;  Surgeon: [name], MD;  Location: [facility];  Service: Gynecology;  Laterality: N/A;  . SALPINGOOPHORECTOMY Bilateral 02/28/2018   Procedure: SALPINGO OOPHORECTOMY;  Surgeon: [name], MD;  Location: [facility];  Service: Gynecology;  Laterality: Bilateral;  . TRIGGER FINGER RELEASE Right 2015  . TUBAL LIGATION  2005   Social History   Occupational History  . Not on file  Tobacco Use  . Smoking status: Never Smoker  . Smokeless tobacco: Never Used  Substance and Sexual Activity  . Alcohol use: Yes    Comment: Ocassionally  . Drug use: Never  . Sexual activity: Not Currently

## 2020-02-05 IMAGING — CT CT ABD-PELV W/ CM
2 of 5 series · 16 of 46 positions shown, 18 images · IV contrast (ISOVUE)
Comparison: Pelvic sonography 02/13/2018

CLINICAL DATA: Lower abdominal pain and abdominal distension,
cystic pelvic mass by ultrasound, preoperative evaluation for
excision of a multi-cystic lesion likely ovarian cancer

EXAM:
CT ABDOMEN AND PELVIS WITH CONTRAST
TECHNIQUE: Multidetector CT imaging of the abdomen and pelvis was performed
using the standard protocol following bolus administration of
intravenous contrast. Sagittal and coronal MPR images reconstructed
from axial data set.
CONTRAST:  100mL [id number] IOPAMIDOL ([id number]) INJECTION 61% IV

[Series 2: axial st · axial · 0.83mm/px · z∈[+1012,+1437]mm · 13 of 99 slices shown, 15 images]
[im 7/99  soft-tissue]
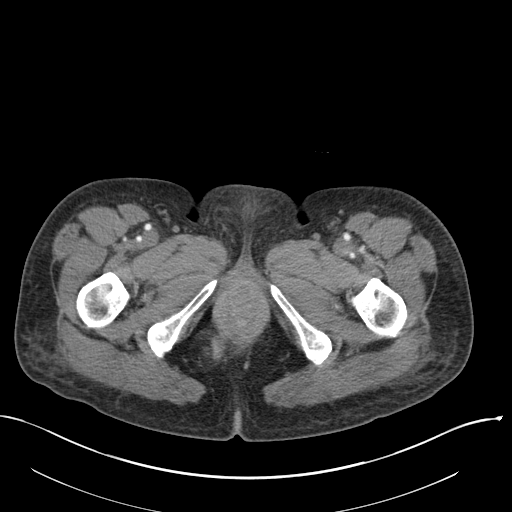
[im 7/99  bone]
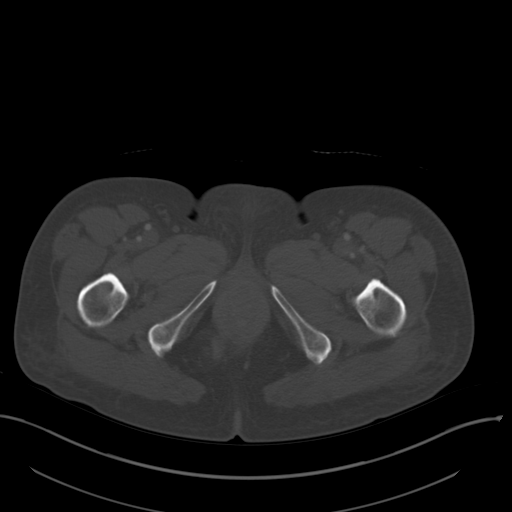
[im 13/99  soft-tissue]
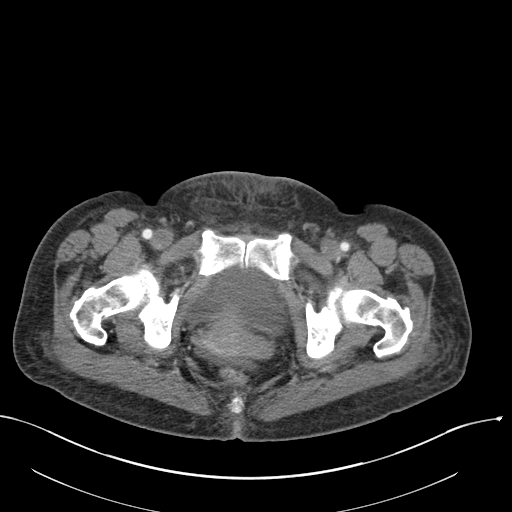
[im 19/99  soft-tissue]
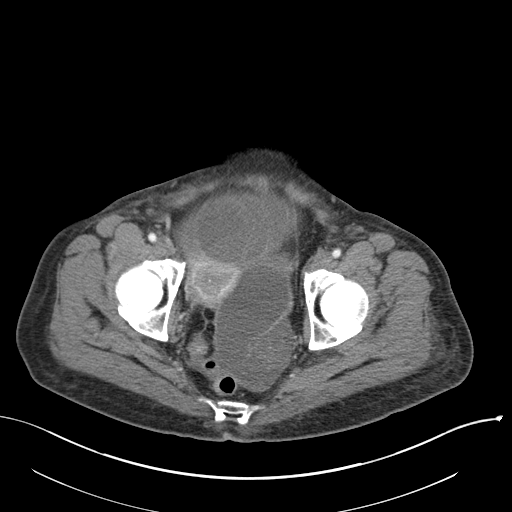
[im 31/99  soft-tissue]
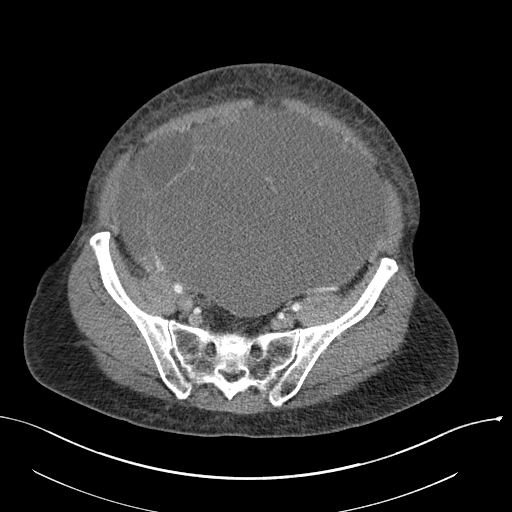
[im 37/99  soft-tissue]
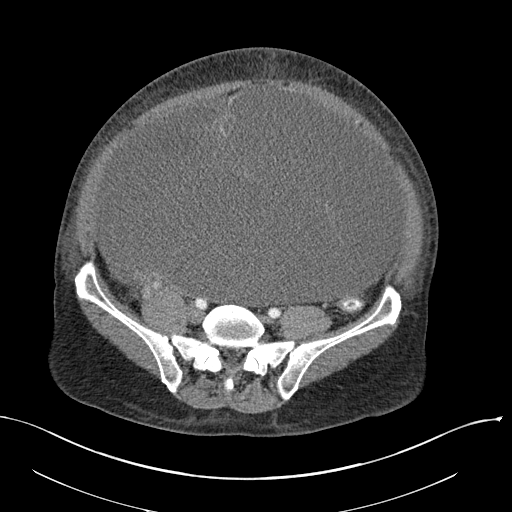
[im 43/99  soft-tissue]
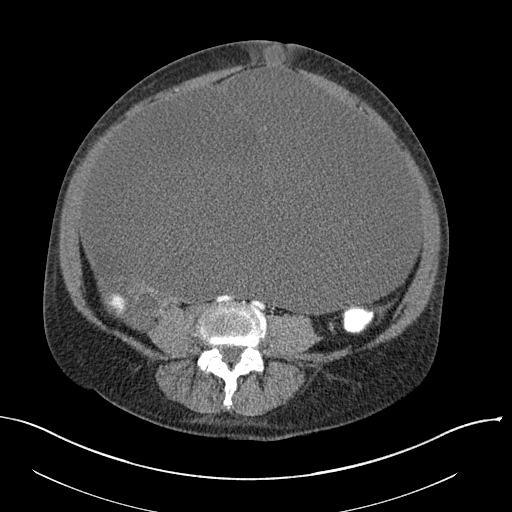
[im 50/99  soft-tissue]
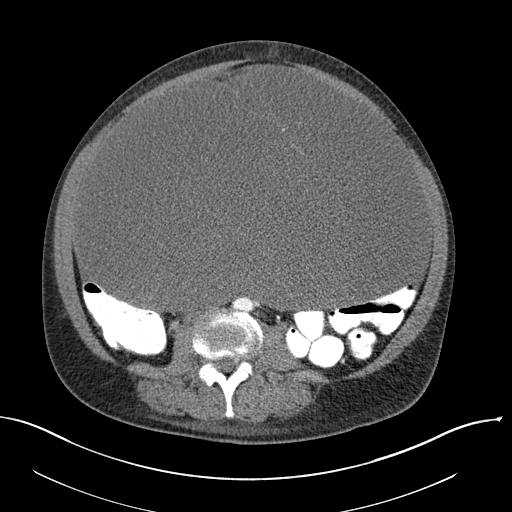
[im 56/99  soft-tissue]
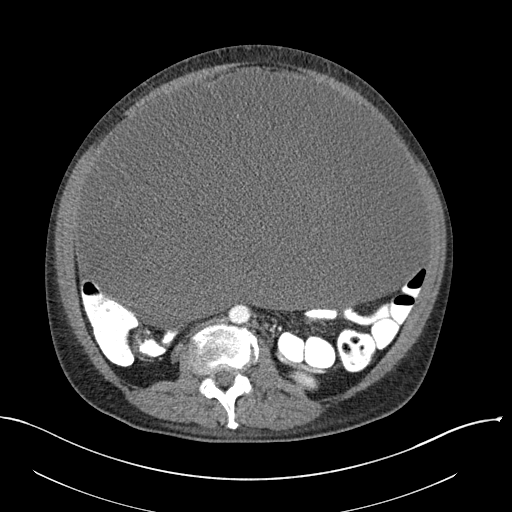
[im 62/99  soft-tissue]
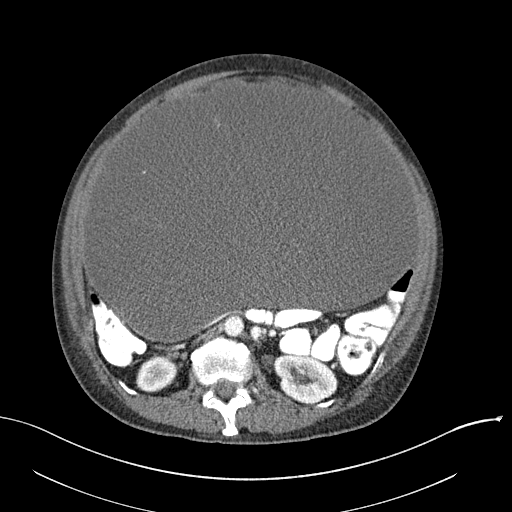
[im 62/99  bone]
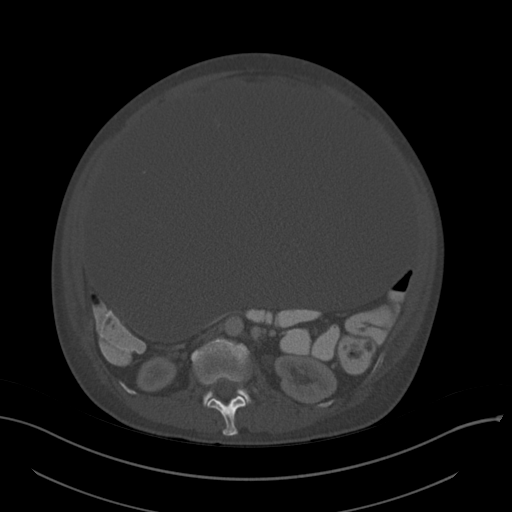
[im 68/99  soft-tissue]
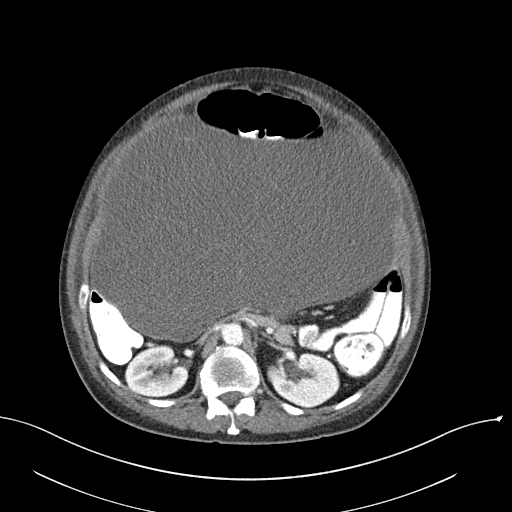
[im 80/99  soft-tissue]
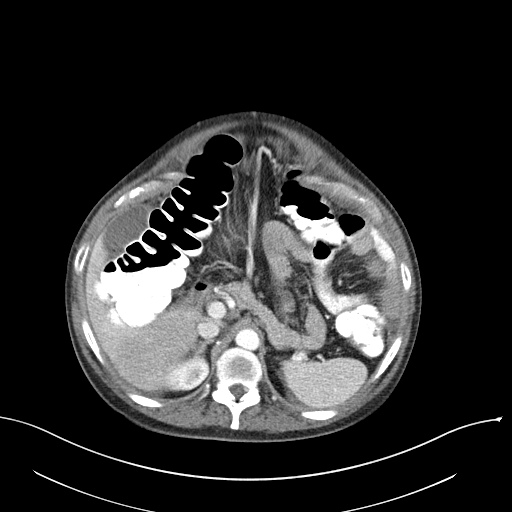
[im 86/99  soft-tissue]
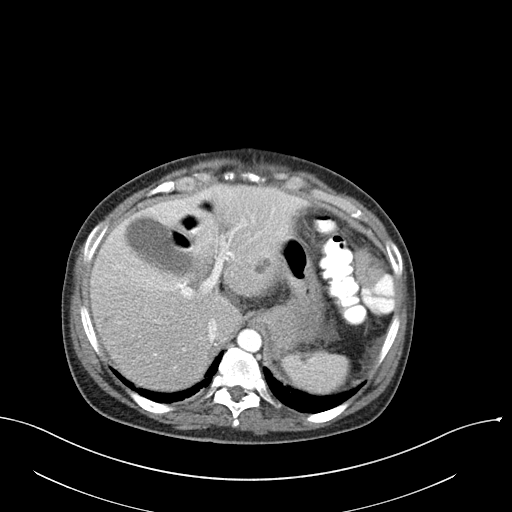
[im 92/99  soft-tissue]
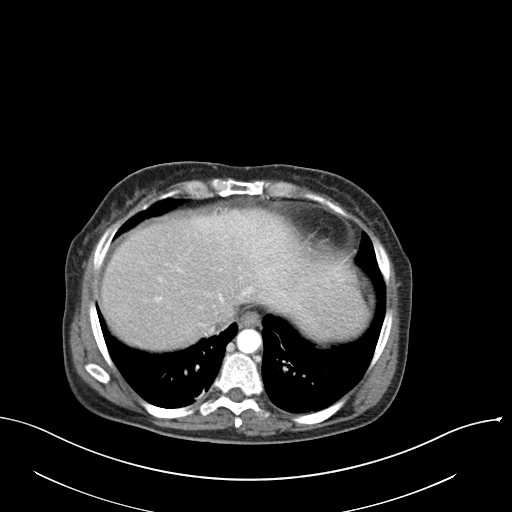

[Series 5: coronal st · coronal · 0.94mm/px · 3 of 116 slices shown]
[im 39/116  soft-tissue]
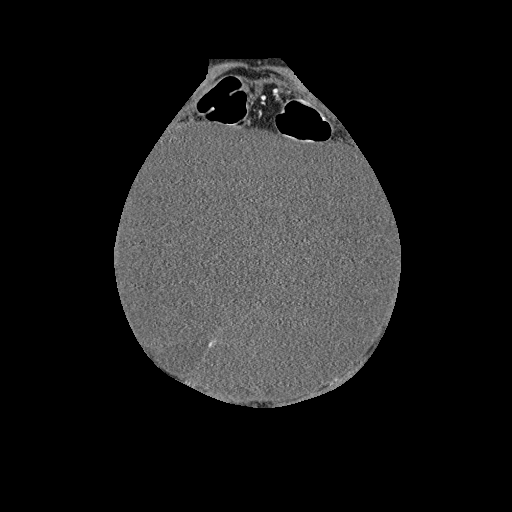
[im 52/116  soft-tissue]
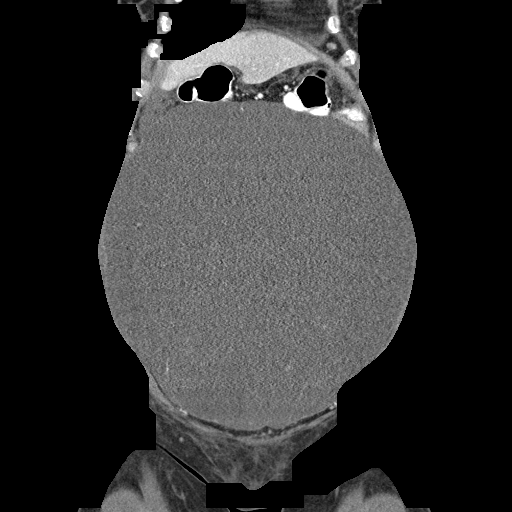
[im 64/116  soft-tissue]
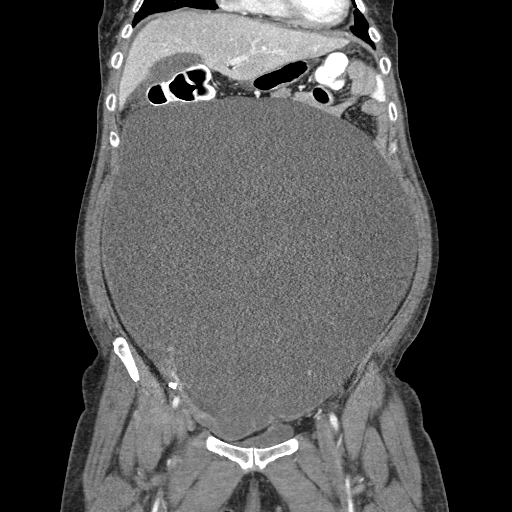

[16 of 46 positions shown; findings below may reference images not displayed]

FINDINGS: Lower chest: Subsegmental atelectasis BILATERAL lower lobes

Hepatobiliary: Subtle nonspecific low-attenuation focus RIGHT lobe
liver 9 mm diameter, image 13. Small cyst lateral segment LEFT lobe
liver 14 mm image 15. Gallbladder and liver otherwise normal
appearance

Pancreas: Normal appearance

Spleen: Normal appearance

Adrenals/Urinary Tract: Adrenal glands and kidneys normal
appearance. Bladder compressed. No obvious ureteral dilatation.

Stomach/Bowel: Bowel loops are displaced by a large abdominal/pelvic
mass. No gross bowel wall thickening or obstruction. Stomach
unremarkable

Vascular/Lymphatic: Vascular structures unremarkable. No adenopathy.
Few scattered normal size mesenteric nodes.

Reproductive: Unremarkable uterus. Large complicated cystic mass
filling the abdomen and pelvis, 30.0 x 32.4 x 20.1 cm, containing
scattered septations. Many more septations and loculations are
visible on the prior ultrasound than are identified by CT. This
likely represents a large neoplasm arising from the RIGHT ovary.
Additional LEFT adnexal mass, multi-cystic, 10.4 x 6.6 x 7.6 cm
likely representing a complicated LEFT ovarian cystic neoplasm.

Other: Small amount of free fluid in pelvis, perihepatic, and
perisplenic. Small amount of fluid extends inferiorly in the RIGHT
pelvis into the RIGHT perirectal space. Tiny umbilical hernia
containing fluid. No free air. Additional small cystic component
extends inferior to the cecum approximately 3.9 x 2.2 cm.

Musculoskeletal: Compression fracture of L1 vertebral body, question
old. No focal bone lesions.
IMPRESSION: Large complicated cystic neoplasm filling the abdominal and pelvic
cavity measuring 30.0 x 32.4 x 20.1 cm and containing numerous
septations, with many more septations and loculations visible by
ultrasound than by CT.

Additional LEFT pelvic complicated cystic mass 10.4 x 6.6 x 7.6 cm.

Findings are most consistent with BILATERAL cystic ovarian neoplasms
much larger on RIGHT, favor mucinous over serous neoplasm.

Small amounts of free fluid are identified in the pelvis,
perisplenic and perihepatic.

Small hepatic cysts with an additional 9 mm nonspecific
low-attenuation focus RIGHT lobe liver.

## 2020-06-09 ENCOUNTER — Ambulatory Visit (INDEPENDENT_AMBULATORY_CARE_PROVIDER_SITE_OTHER): Payer: Medicaid Other | Admitting: Internal Medicine

## 2020-06-09 ENCOUNTER — Other Ambulatory Visit: Payer: Self-pay

## 2020-06-09 ENCOUNTER — Encounter: Payer: Self-pay | Admitting: Internal Medicine

## 2020-06-09 VITALS — BP 108/69 | HR 81 | Resp 16 | Ht 62.5 in | Wt 166.8 lb

## 2020-06-09 DIAGNOSIS — M19271 Secondary osteoarthritis, right ankle and foot: Secondary | ICD-10-CM | POA: Diagnosis not present

## 2020-06-09 DIAGNOSIS — M797 Fibromyalgia: Secondary | ICD-10-CM | POA: Diagnosis not present

## 2020-06-09 NOTE — Assessment & Plan Note (Signed)
[name] symptoms are consistent with fibromyalgia syndrome with patient reported widespread pain index 7 out of 19 and symptom severity score 7 out of 12.  Her pattern of chronic fatigue and prolonged symptom exacerbation after heavy activity also fit with this.  She is currently on pregabalin for chronic pain which is a reasonable pharmacotherapy option and would not add a second agent approved for FMS due to risk of side effects.  She has not participated in any formal therapy for several years and is could be beneficial to recommend additions to her current exercise and activity.  I do not see any findings or results suggesting a current inflammatory process needing immunomodulatory treatment.  Referral to physical therapy Provided patient with information and and encouraged use of [employer] fibroguide for additional resources No specific rheumatology follow-up recommended at this time

## 2020-06-09 NOTE — Progress Notes (Signed)
Office Visit Note  Patient: Teresa Richardson             Date of Birth: January 02, 1966           MRN: [id number]             PCP: Glenda Chroman, MD Referring: Glenda Chroman, MD Visit Date: 06/09/2020   Subjective:  No chief complaint on file.   History of Present Illness: Teresa Richardson is a 54 y.o. female with a history of appendiceal cancer with peritoneal metastases, hypothyroidism, chronic pain, secondary osteoarthritis who is referred here for evaluation of diffuse myalgias.  Her pain symptoms are longstanding and she was previously evaluated by a rheumatologist in [facility] about 5 or 6 years ago.  She was felt to have a myofascial pain syndrome with multiple tender points at that time and was also treated for a trigger finger.  However more recently she experiences fairly diffuse pain involving the jaw, neck, shoulder, bilateral hips, bilateral hands, and right ankle.  The right ankle pain is the oldest and subsequent due to a fracture decades ago.  Her pain elsewhere is often worse with several hours of morning stiffness but also gets worsened after overexerting herself that lasts up to 3 days.  She also experiences carpal tunnel syndrome increasing the hand pain intermittently.  She reports noticing swelling in her hands, knees, and hips after lying in bed at night.  She reports fair sleep quality with awakening 1-3 times per night generally to use the bathroom.  Prior inflammatory work-up includes negative ANA, negative rheumatoid factor, normal ESR, normal CRP.  Her thyroid level and replacement have been stable at recent care visits.  Activities of Daily Living:  Patient reports morning stiffness for 2-3  hours.   Patient Reports nocturnal pain.  Difficulty dressing/grooming: Denies Difficulty climbing stairs: Denies Difficulty getting out of chair: Denies Difficulty using hands for taps, buttons, cutlery, and/or writing: Reports  Review of Systems  Constitutional:  Positive for fatigue.  HENT: Positive for nose dryness. Negative for mouth sores and mouth dryness.   Eyes: Positive for dryness. Negative for itching.  Respiratory: Negative for shortness of breath and difficulty breathing.   Cardiovascular: Negative for chest pain and palpitations.  Gastrointestinal: Negative for blood in stool, constipation and diarrhea.  Endocrine: Negative for increased urination.  Genitourinary: Negative for difficulty urinating and painful urination.  Musculoskeletal: Positive for arthralgias, joint pain, joint swelling, myalgias, morning stiffness, muscle tenderness and myalgias.  Skin: Positive for color change, rash and redness.  Allergic/Immunologic: Negative for susceptible to infections.  Neurological: Positive for dizziness, numbness, headaches, memory loss and weakness.  Hematological: Positive for bruising/bleeding tendency.  Psychiatric/Behavioral: Negative for confusion and sleep disturbance.    PMFS History:  Patient Active Problem List   Diagnosis Date Noted  . Fibromyalgia 06/09/2020  . 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 Carpal tunnel syndrome, bilateral 10/03/2019  . Pelvic mass in female 02/28/2018  . Appendiceal tumor   . Peritoneal metastases ([place])   . Pelvic mass 02/17/2018    Past Medical History:  Diagnosis Date  . Arthritis   . Constipation   . MRSA (methicillin resistant staph aureus) culture positive   . Myofascial pain syndrome    Myofascial pain disorder    Family History  Problem Relation Age of Onset  . Breast cancer Mother 48  . Breast cancer Sister 44  . Colon cancer Maternal Uncle   . Cancer Maternal Grandmother   .  Parkinson's disease Father   . Kidney disease Son   . Fibromyalgia Daughter   . Kidney disease Daughter    Past Surgical History:  Procedure Laterality Date  . APPENDECTOMY N/A 02/28/2018   Procedure: APPENDECTOMY;  Surgeon: [name], MD;  Location: [facility];  Service:  Gynecology;  Laterality: N/A;  . CARPAL TUNNEL RELEASE Bilateral   . CESAREAN SECTION     x 2  . HAND DEBRIDEMENT Left 09/2011   infected after splinter with MRSA  . LAPAROTOMY N/A 02/28/2018   Procedure: EXPLORATORY LAPAROTOMY, PERITONEAL BIOPSIES;  Surgeon: [name], MD;  Location: [facility];  Service: Gynecology;  Laterality: N/A;  . SALPINGOOPHORECTOMY Bilateral 02/28/2018   Procedure: SALPINGO OOPHORECTOMY;  Surgeon: [name], MD;  Location: [facility];  Service: Gynecology;  Laterality: Bilateral;  . TRIGGER FINGER RELEASE Right 2015  . TUBAL LIGATION  2005   Social History   Social History Narrative  . Not on file   Immunization History  Administered Date(s) Administered  . HiB (PRP-T) 06/19/2018  . Influenza Inj Mdck Quad Pf 06/18/2018  . Meningococcal B, OMV 10/04/2018  . Meningococcal Conjugate 06/19/2018, 10/04/2018  . Moderna SARS-COVID-2 Vaccination 12/06/2019, 01/03/2020  . Pneumococcal Polysaccharide-23 06/19/2018, 10/04/2018     Objective: Vital Signs: BP 108/69 (BP Location: Right Arm, Patient Position: Sitting, Cuff Size: Normal)   Pulse 81   Resp 16   Ht 5' 2.5" (1.588 m)   Wt 166 lb 12.8 oz (75.7 kg)   LMP 07/07/2010   BMI 30.02 kg/m    Physical Exam HENT:     Head: Normocephalic.  Cardiovascular:     Rate and Rhythm: Normal rate and regular rhythm.     Pulses: Normal pulses.  Pulmonary:     Effort: Pulmonary effort is normal.     Breath sounds: Normal breath sounds.  Skin:    General: Skin is warm and dry.  Neurological:     Mental Status: She is alert.      Musculoskeletal Exam: Neck normal range of motion, mild tenderness Shoulder and elbows with normal range of motion no tenderness or swelling Left greater than right wrist tenderness with normal range of motion and no swelling Fingers normal range of motion no tenderness or swelling Hips normal internal and external rotation, tender to palpation over lateral hip right greater  than left Knees normal range of motion no tenderness or swelling Right ankle range of motion mildly reduced compared to left, no swelling  CDAI Exam: CDAI Score: -- Patient Global: --; Provider Global: -- Swollen: 0 ; Tender: 3  Joint Exam 06/09/2020      Right  Left  Wrist   Tender   Tender  Ankle   Tender        Investigation: No additional findings.  Imaging: No results found.  Recent Labs: Lab Results  Component Value Date   WBC 9.2 03/01/2018   HGB 9.4 (L) 03/01/2018   PLT 704 (H) 03/01/2018   NA 141 03/01/2018   K 4.0 03/01/2018   CL 108 03/01/2018   CO2 28 03/01/2018   GLUCOSE 113 (H) 03/01/2018   BUN 6 03/01/2018   CREATININE 0.57 03/01/2018   BILITOT 0.3 02/17/2018   ALKPHOS 172 (H) 02/17/2018   AST 41 (H) 02/17/2018   ALT 44 02/17/2018   PROT 8.4 (H) 02/17/2018   ALBUMIN 3.7 02/17/2018   CALCIUM 7.9 (L) 03/01/2018   GFRAA >60 03/01/2018    Speciality Comments: No specialty comments available.  Procedures:  No procedures performed Allergies: Eggs or egg-derived products and Penicillins   Assessment / Plan:     Visit Diagnoses: Fibromyalgia - Plan: Ambulatory referral to Physical Therapy  Secondary osteoarthritis, right ankle and foot - Plan: Ambulatory referral to Physical Therapy  Orders: Orders Placed This Encounter  Procedures  . Ambulatory referral to Physical Therapy   No orders of the defined types were placed in this encounter.   Follow-Up Instructions: No follow-ups on file.    Collier Salina, MD  Note - This record has been created using [employer].  Chart creation errors have been sought, but may not always  have been located. Such creation errors do not reflect on  the standard of medical care.

## 2020-06-09 NOTE — Patient Instructions (Addendum)
1)  I recommend checking out the website [email or web address] this is a website created in collaboration with the rheumatology department at [facility] containing up to date information with some videos and articles about self care and symptoms.  2) I am referring you for physical therapy evaluation and management.  3) You are currently on appropriate medical treatment for symptoms, although non-medication treatments are important for this type of chronic pain.      Myofascial Pain Syndrome and Fibromyalgia Myofascial pain syndrome and fibromyalgia are both pain disorders. This pain may be felt mainly in your muscles.  Myofascial pain syndrome: ? Always has tender points in the muscle that will cause pain when pressed (trigger points). The pain may come and go. ? Usually affects your neck, upper back, and shoulder areas. The pain often radiates into your arms and hands.  Fibromyalgia: ? Has muscle pains and tenderness that come and go. ? Is often associated with fatigue and sleep problems. ? Has trigger points. ? Tends to be long-lasting (chronic), but is not life-threatening. Fibromyalgia and myofascial pain syndrome are not the same. However, they often occur together. If you have both conditions, each can make the other worse. Both are common and can cause enough pain and fatigue to make day-to-day activities difficult. Both can be hard to diagnose because their symptoms are common in many other conditions. What are the causes? The exact causes of these conditions are not known. What increases the risk? You are more likely to develop this condition if:  You have a family history of the condition.  You have certain triggers, such as: ? Spine disorders. ? An injury (trauma) or other physical stressors. ? Being under a lot of stress. ? Medical conditions such as osteoarthritis, rheumatoid arthritis, or lupus. What are the signs or symptoms? Fibromyalgia The main  symptom of fibromyalgia is widespread pain and tenderness in your muscles. Pain is sometimes described as stabbing, shooting, or burning. You may also have:  Tingling or numbness.  Sleep problems and fatigue.  Problems with attention and concentration (fibro fog). Other symptoms may include:  Bowel and bladder problems.  Headaches.  Visual problems.  Problems with odors and noises.  Depression or mood changes.  Painful menstrual periods (dysmenorrhea).  Dry skin or eyes. These symptoms can vary over time. Myofascial pain syndrome Symptoms of myofascial pain syndrome include:  Tight, ropy bands of muscle.  Uncomfortable sensations in muscle areas. These may include aching, cramping, burning, numbness, tingling, and weakness.  Difficulty moving certain parts of the body freely (poor range of motion). How is this diagnosed? This condition may be diagnosed by your symptoms and medical history. You will also have a physical exam. In general:  Fibromyalgia is diagnosed if you have pain, fatigue, and other symptoms for more than 3 months, and symptoms cannot be explained by another condition.  Myofascial pain syndrome is diagnosed if you have trigger points in your muscles, and those trigger points are tender and cause pain elsewhere in your body (referred pain). How is this treated? Treatment for these conditions depends on the type that you have.  For fibromyalgia: ? Pain medicines, such as NSAIDs. ? Medicines for treating depression. ? Medicines for treating seizures. ? Medicines that relax the muscles.  For myofascial pain: ? Pain medicines, such as NSAIDs. ? Cooling and stretching of muscles. ? Trigger point injections. ? Sound wave (ultrasound) treatments to stimulate muscles. Treating these conditions often requires a team of health care providers. These  may include:  Your primary care provider.  Physical therapist.  Complementary health care providers, such  as massage therapists or acupuncturists.  Psychiatrist for cognitive behavioral therapy. Follow these instructions at home: Medicines  Take over-the-counter and prescription medicines only as told by your health care provider.  Do not drive or use heavy machinery while taking prescription pain medicine.  If you are taking prescription pain medicine, take actions to prevent or treat constipation. Your health care provider may recommend that you: ? Drink enough fluid to keep your urine pale yellow. ? Eat foods that are high in fiber, such as fresh fruits and vegetables, whole grains, and beans. ? Limit foods that are high in fat and processed sugars, such as fried or sweet foods. ? Take an over-the-counter or prescription medicine for constipation. Lifestyle   Exercise as directed by your health care provider or physical therapist.  Practice relaxation techniques to control your stress. You may want to try: ? Biofeedback. ? Visual imagery. ? Hypnosis. ? Muscle relaxation. ? Yoga. ? Meditation.  Maintain a healthy lifestyle. This includes eating a healthy diet and getting enough sleep.  Do not use any products that contain nicotine or tobacco, such as cigarettes and e-cigarettes. If you need help quitting, ask your health care provider. General instructions  Talk to your health care provider about complementary treatments, such as acupuncture or massage.  Consider joining a support group with others who are diagnosed with this condition.  Do not do activities that stress or strain your muscles. This includes repetitive motions and heavy lifting.  Keep all follow-up visits as told by your health care provider. This is important. Where to find more information  National Fibromyalgia Association: www.fmaware.[place]: www.arthritis.org  American Chronic Pain Association: www.theacpa.org Contact a health care provider if:  You have new symptoms.  Your  symptoms get worse or your pain is severe.  You have side effects from your medicines.  You have trouble sleeping.  Your condition is causing depression or anxiety. Summary  Myofascial pain syndrome and fibromyalgia are pain disorders.  Myofascial pain syndrome has tender points in the muscle that will cause pain when pressed (trigger points). Fibromyalgia also has muscle pains and tenderness that come and go, but this condition is often associated with fatigue and sleep disturbances.  Fibromyalgia and myofascial pain syndrome are not the same but often occur together, causing pain and fatigue that make day-to-day activities difficult.  Treatment for fibromyalgia includes taking medicines to relax the muscles and medicines for pain, depression, or seizures. Treatment for myofascial pain syndrome includes taking medicines for pain, cooling and stretching of muscles, and injecting medicines into trigger points.  Follow your health care provider's instructions for taking medicines and maintaining a healthy lifestyle. This information is not intended to replace advice given to you by your health care provider. Make sure you discuss any questions you have with your health care provider. Document Revised: 12/15/2018 Document Reviewed: 09/07/2017 Elsevier Patient Education  2020 [employer].

## 2020-06-09 NOTE — Assessment & Plan Note (Signed)
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  I do not think this is a major contributor to her systemic complaints however.  The pharmacological and nonpharmacological treatments for that are also the appropriate management for osteoarthritis.

## 2020-06-10 ENCOUNTER — Encounter (HOSPITAL_COMMUNITY): Payer: Self-pay | Admitting: Physical Therapy

## 2020-06-10 ENCOUNTER — Other Ambulatory Visit: Payer: Self-pay

## 2020-06-10 ENCOUNTER — Ambulatory Visit (HOSPITAL_COMMUNITY): Payer: Medicaid Other | Attending: Internal Medicine | Admitting: Physical Therapy

## 2020-06-10 DIAGNOSIS — M25671 Stiffness of right ankle, not elsewhere classified: Secondary | ICD-10-CM | POA: Diagnosis present

## 2020-06-10 DIAGNOSIS — M6281 Muscle weakness (generalized): Secondary | ICD-10-CM | POA: Diagnosis present

## 2020-06-10 DIAGNOSIS — M25571 Pain in right ankle and joints of right foot: Secondary | ICD-10-CM | POA: Insufficient documentation

## 2020-06-10 NOTE — Patient Instructions (Addendum)
Ankle Circle (Ankle Mobility / Flexibility)    Sit facing forward. Extend one leg forward, foot a few inches above floor, with toes pointed (or resting on the floor). Rotate ankle, making circle with foot, while breathing in and out slowly. Repeat _10__ times in each direction. Repeat with other foot. Do _1-2__ sessions per day.  Copyright  VHI. All rights reserved.   Toe / Heel Raise (Sitting)    Sitting, raise heels, then rock back on heels and raise toes. Repeat _10___ times.  Copyright  VHI. All rights reserved.

## 2020-06-10 NOTE — Therapy (Signed)
[place], [place], [address] Phone: [phone]   Fax:  [phone]  Physical Therapy Evaluation  Patient Details  Name: Teresa Richardson MRN: [id number] Date of Birth: 1966/05/02 Referring Provider (PT): Collier Salina, MD   Encounter Date: 06/10/2020   PT End of Session - 06/10/20 1752    Visit Number 1    Number of Visits 8    Date for PT Re-Evaluation 07/08/20    Authorization Type Medicaid - [employer]    Authorization Time Period Authorization requested [place] - Visit Number 1    Authorization - Number of Visits 8    Progress Note Due on Visit 8    PT Start Time 9628    PT Stop Time 1515    PT Time Calculation (min) 36 min    Activity Tolerance Patient tolerated treatment well    Behavior During Therapy WFL for tasks assessed/performed           Past Medical History:  Diagnosis Date   Arthritis    Constipation    MRSA (methicillin resistant staph aureus) culture positive    Myofascial pain syndrome    Myofascial pain disorder    Past Surgical History:  Procedure Laterality Date   APPENDECTOMY N/A 02/28/2018   Procedure: APPENDECTOMY;  Surgeon: [name], MD;  Location: [facility];  Service: Gynecology;  Laterality: N/A;   CARPAL TUNNEL RELEASE Bilateral    CESAREAN SECTION     x 2   HAND DEBRIDEMENT Left 09/2011   infected after splinter with MRSA   LAPAROTOMY N/A 02/28/2018   Procedure: EXPLORATORY LAPAROTOMY, PERITONEAL BIOPSIES;  Surgeon: [name], MD;  Location: [facility];  Service: Gynecology;  Laterality: N/A;   SALPINGOOPHORECTOMY Bilateral 02/28/2018   Procedure: SALPINGO OOPHORECTOMY;  Surgeon: [name], MD;  Location: [facility];  Service: Gynecology;  Laterality: Bilateral;   TRIGGER FINGER RELEASE Right 2015   TUBAL LIGATION  2005    There were no vitals filed for this visit.    Subjective Assessment - 06/10/20 1447     Subjective Patient reported that she has been having issues with her right foot and ankle since she broke her foot 40 years ago. She stated that she also broke both legs and her back at that time. She stated she wore casts on both legs. She reported that she has fibromyalgia, but her big concern is her right foot and ankle. She reports that sometimes she has trouble walking on it. She reported [facility]. Patient reports that she has some difficulty with her left hand.    Pertinent History History of fall and breaking foot    Limitations Standing;Walking    How long can you sit comfortably? It depends    How long can you stand comfortably? 15 minutes    How long can you walk comfortably? Can walk 3 miles, but then she is achey for the next 3 days    Patient Stated Goals To have her ankle feel looser    Currently in Pain? No/denies              [facility] PT Assessment - 06/10/20 0001      Assessment   Medical Diagnosis Fibromyalgia; Secondary OA RT ankle and foot    Referring Provider (PT) Benjamine Mola Resa Miner, MD    Onset Date/Surgical Date --  About 40 years   Next MD Visit None    Prior Therapy Yes      Precautions   Precautions None      Restrictions   Weight Bearing Restrictions No      Balance Screen   Has the patient fallen in the past 6 months No      [place] residence    Living Arrangements Non-relatives/Friends    Type of [place] to enter    Entrance Stairs-Number of Steps 3    [place] One level      Prior Function   Level of Independence Independent    Vocation Unemployed      Cognition   Overall Cognitive Status Within Functional Limits for tasks assessed      ROM / Strength   AROM / PROM / Strength AROM;Strength      AROM   AROM Assessment Site Ankle    Right/Left Ankle Right;Left    Right Ankle Dorsiflexion 4    Right  Ankle Plantar Flexion 65    Right Ankle Inversion 20    Right Ankle Eversion 30    Left Ankle Dorsiflexion 10    Left Ankle Plantar Flexion 65    Left Ankle Inversion 20    Left Ankle Eversion 30      Strength   Strength Assessment Site Ankle    Right/Left Ankle Right;Left    Right Ankle Dorsiflexion 5/5    Right Ankle Plantar Flexion 4-/5    Right Ankle Inversion 5/5    Right Ankle Eversion 5/5    Left Ankle Dorsiflexion 5/5    Left Ankle Plantar Flexion 5/5    Left Ankle Inversion 5/5    Left Ankle Eversion 5/5      Balance   Balance Assessed Yes      Static Standing Balance   Static Standing - Balance Support No upper extremity supported    Static Standing Balance -  Activities  Single Leg Stance - Right Leg;Single Leg Stance - Left Leg    Static Standing - Comment/# of Minutes LT: 6 seconds; RT: 4 seconds                      Objective measurements completed on examination: See above findings.               PT Education - 06/10/20 1752    Education Details Discussed examination findings, POC, and initial HEP.    Person(s) Educated Patient    Methods Explanation;Handout    Comprehension Verbalized understanding            PT Short Term Goals - 06/10/20 1756      PT SHORT TERM GOAL #1   Title Patient will report understanding and regular compliance with HEP to improve strength, mobility and decrease pain.    Time 2    Period Weeks    Status New    Target Date 06/24/20             PT Long Term Goals - 06/10/20 1757      PT LONG TERM GOAL #1   Title Patient will report improvement in overall subjective complaint of at least 25% for improved QOL.    Time 4    Period Weeks    Status New    Target Date 07/08/20      PT LONG TERM GOAL #2  Title Patient will demonstrate an improvement of 1/2 MMT strength grade in RT ankle PF for improved push-off with gait.    Time 4    Period Weeks    Status New    Target Date 07/08/20       PT LONG TERM GOAL #3   Title Patient will demonstrate ability to maintain SLS for at least 10 seconds indicating improved stability and safety for performing stair negotiation.    Time 4    Period Weeks    Status New    Target Date 07/08/20                  Plan - 06/10/20 1820    Clinical Impression Statement Patient is a 54 year old female who presents to outpatient physical therapy with primary complaint of right foot and ankle pain and stiffness which has been ongoing for many years. In addition, patient has a medical history significant for fibromyalgia. Patient demonstrates decreased mobility, decreased strength and decreased balance which are overall impacting her functional mobility. Patient would benefit from continued skilled physical therapy in order to address the abovementioned deficits and help her return to her PLOF.    Personal Factors and Comorbidities Comorbidity 2    Comorbidities Fibromyalgia, History of cancer    Examination-Activity Limitations Stand;Locomotion Level;Transfers    Examination-Participation Restrictions Community Activity    Stability/Clinical Decision Making Stable/Uncomplicated    Clinical Decision Making Low    Rehab Potential Fair    PT Frequency 2x / week    PT Duration 4 weeks    PT Treatment/Interventions ADLs/Self Care Home Management;Aquatic Therapy;Electrical Stimulation;Moist Heat;DME Instruction;Gait training;Stair training;Functional mobility training;Therapeutic activities;Therapeutic exercise;Balance training;Neuromuscular re-education;Patient/family education;Orthotic Fit/Training;Manual techniques;Passive range of motion;Dry needling;Taping    PT Next Visit Plan Review eval/goals, strengthening PFs, review HEP    PT Home Exercise Plan 06/10/20: Ankle circle, heel raise toe raises    Consulted and Agree with Plan of Care Patient           Patient will benefit from skilled therapeutic intervention in order to improve the  following deficits and impairments:  Pain, Decreased mobility, Decreased activity tolerance, Decreased range of motion, Decreased strength, Hypomobility, Decreased balance, Difficulty walking  Visit Diagnosis: Pain in right ankle and joints of right foot  Stiffness of right ankle, not elsewhere classified  Muscle weakness (generalized)     Problem List Patient Active Problem List   Diagnosis Date Noted   Fibromyalgia 06/09/2020   Secondary osteoarthritis, 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

## 2020-06-12 ENCOUNTER — Ambulatory Visit (HOSPITAL_COMMUNITY): Payer: Medicaid Other | Admitting: Physical Therapy

## 2020-06-12 ENCOUNTER — Encounter (HOSPITAL_COMMUNITY): Payer: Self-pay | Admitting: Physical Therapy

## 2020-06-12 ENCOUNTER — Other Ambulatory Visit: Payer: Self-pay

## 2020-06-12 DIAGNOSIS — M6281 Muscle weakness (generalized): Secondary | ICD-10-CM

## 2020-06-12 DIAGNOSIS — M25671 Stiffness of right ankle, not elsewhere classified: Secondary | ICD-10-CM

## 2020-06-12 DIAGNOSIS — M25571 Pain in right ankle and joints of right foot: Secondary | ICD-10-CM

## 2020-06-12 NOTE — Therapy (Signed)
[place], [place], [address] Phone: [phone]   Fax:  [phone]  Physical Therapy Treatment  Patient Details  Name: Teresa Richardson MRN: [id number] Date of Birth: 10-08-1965 Referring Provider (PT): Collier Salina, MD   Encounter Date: 06/12/2020   PT End of Session - 06/12/20 0823    Visit Number 2    Number of Visits 8    Date for PT Re-Evaluation 07/08/20    Authorization Type Medicaid - [employer]    Authorization Time Period Authorization requested [place] - Visit Number 2    Authorization - Number of Visits 8    Progress Note Due on Visit 8    PT Start Time 0817    PT Stop Time 0855    PT Time Calculation (min) 38 min    Activity Tolerance Patient tolerated treatment well    Behavior During Therapy [facility] for tasks assessed/performed           Past Medical History:  Diagnosis Date  . Arthritis   . Constipation   . MRSA (methicillin resistant staph aureus) culture positive   . Myofascial pain syndrome    Myofascial pain disorder    Past Surgical History:  Procedure Laterality Date  . APPENDECTOMY N/A 02/28/2018   Procedure: APPENDECTOMY;  Surgeon: [name], MD;  Location: [facility];  Service: Gynecology;  Laterality: N/A;  . CARPAL TUNNEL RELEASE Bilateral   . CESAREAN SECTION     x 2  . HAND DEBRIDEMENT Left 09/2011   infected after splinter with MRSA  . LAPAROTOMY N/A 02/28/2018   Procedure: EXPLORATORY LAPAROTOMY, PERITONEAL BIOPSIES;  Surgeon: [name], MD;  Location: [facility];  Service: Gynecology;  Laterality: N/A;  . SALPINGOOPHORECTOMY Bilateral 02/28/2018   Procedure: SALPINGO OOPHORECTOMY;  Surgeon: [name], MD;  Location: [facility];  Service: Gynecology;  Laterality: Bilateral;  . TRIGGER FINGER RELEASE Right 2015  . TUBAL LIGATION  2005    There were no vitals filed for this visit.   Subjective Assessment - 06/12/20 0818     Subjective Patient reports that she is having foot tightness right now.    Pertinent History History of fall and breaking foot    Limitations Standing;Walking    How long can you sit comfortably? It depends    How long can you stand comfortably? 15 minutes    How long can you walk comfortably? Can walk 3 miles, but then she is achey for the next 3 days    Patient Stated Goals To have her ankle feel looser    Currently in Pain? Yes    Pain Score 3     Pain Location Foot    Pain Orientation Right    Pain Descriptors / Indicators Tightness    Pain Type Chronic pain    Pain Onset More than a month ago    Pain Frequency Intermittent                             [facility] Adult PT Treatment/Exercise - 06/12/20 0001      Exercises   Exercises Ankle      Ankle Exercises: Standing   Vector Stance Right;Left;5 reps;5 seconds    Vector Stance Limitations Forward, side, back. 1 HHA cues to reduce trunk lean    Heel Raises Both;15 reps    Heel Raises Limitations 1 second raise 3  second lowering      Ankle Exercises: Seated   ABC's 2 reps    Ankle Circles/Pumps AROM;Right;10 reps    Heel Raises Both;15 reps    Toe Raise 15 reps    Toe Raise Limitations Both    BAPS Level 2;Sitting;10 reps    BAPS Limitations RT foot anterior/posterior, LT/RT, CW/CCW                    PT Short Term Goals - 06/12/20 0820      PT SHORT TERM GOAL #1   Title Patient will report understanding and regular compliance with HEP to improve strength, mobility and decrease pain.    Time 2    Period Weeks    Status On-going    Target Date 06/24/20             PT Long Term Goals - 06/12/20 0820      PT LONG TERM GOAL #1   Title Patient will report improvement in overall subjective complaint of at least 25% for improved QOL.    Time 4    Period Weeks    Status On-going      PT LONG TERM GOAL #2   Title Patient will demonstrate an improvement of 1/2 MMT strength grade in RT  ankle PF for improved push-off with gait.    Time 4    Period Weeks    Status On-going      PT LONG TERM GOAL #3   Title Patient will demonstrate ability to maintain SLS for at least 10 seconds indicating improved stability and safety for performing stair negotiation.    Time 4    Period Weeks    Status On-going                 Plan - 06/12/20 0932    Clinical Impression Statement Began session by discussing patient's goals and her HEP. Patient reports understanding with HEP, but does require some cueing to perform ankle mobility exercises using her ankle rather than compensating at her knee or her hip. Progressed patient this session to standing ankle strengthening to improve patient's plantarflexion strength. Patient did demonstrate difficulty with performing BAPS exercises using ankle only and required frequent cueing. Patient would benefit from continued skilled physical therapy in order to continue progressing towards reaching functional goals.    Personal Factors and Comorbidities Comorbidity 2    Comorbidities Fibromyalgia, History of cancer    Examination-Activity Limitations Stand;Locomotion Level;Transfers    Examination-Participation Restrictions Community Activity    Stability/Clinical Decision Making Stable/Uncomplicated    Rehab Potential Fair    PT Frequency 2x / week    PT Duration 4 weeks    PT Treatment/Interventions ADLs/Self Care Home Management;Aquatic Therapy;Electrical Stimulation;Moist Heat;DME Instruction;Gait training;Stair training;Functional mobility training;Therapeutic activities;Therapeutic exercise;Balance training;Neuromuscular re-education;Patient/family education;Orthotic Fit/Training;Manual techniques;Passive range of motion;Dry needling;Taping    PT Next Visit Plan Continued to review mobility exercises as needed to reduce knee and hip compensations. Progress standing strengthening as able.    PT Home Exercise Plan 06/10/20: Ankle circle, heel raise  toe raises    Consulted and Agree with Plan of Care Patient           Patient will benefit from skilled therapeutic intervention in order to improve the following deficits and impairments:  Pain, Decreased mobility, Decreased activity tolerance, Decreased range of motion, Decreased strength, Hypomobility, Decreased balance, Difficulty walking  Visit Diagnosis: Pain in right ankle and joints of right foot  Stiffness of right  ankle, not elsewhere classified  Muscle weakness (generalized)     Problem List Patient Active Problem List   Diagnosis Date Noted  . Fibromyalgia 06/09/2020  . 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 Carpal tunnel syndrome, bilateral 10/03/2019  . Pelvic mass in female 02/28/2018  . Appendiceal tumor   . 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 Pelvic mass 02/17/2018   Clarene Critchley PT, DPT 8:59 AM, 06/12/20 [place] [address] [place], [place], [address] Phone: [phone]   Fax:  [phone]  Name: Teresa Richardson MRN: [id number] Date of Birth: Mar 01, 1966

## 2020-06-18 ENCOUNTER — Encounter (HOSPITAL_COMMUNITY): Payer: Self-pay

## 2020-06-18 ENCOUNTER — Ambulatory Visit (HOSPITAL_COMMUNITY): Payer: Medicaid Other

## 2020-06-18 ENCOUNTER — Other Ambulatory Visit: Payer: Self-pay

## 2020-06-18 DIAGNOSIS — M25671 Stiffness of right ankle, not elsewhere classified: Secondary | ICD-10-CM

## 2020-06-18 DIAGNOSIS — M25571 Pain in right ankle and joints of right foot: Secondary | ICD-10-CM

## 2020-06-18 DIAGNOSIS — M6281 Muscle weakness (generalized): Secondary | ICD-10-CM

## 2020-06-18 NOTE — Therapy (Signed)
[place], [place], [address] Phone: [phone]   Fax:  [phone]  Physical Therapy Treatment  Patient Details  Name: Teresa Richardson MRN: [id number] Date of Birth: [birth year] Referring Provider (PT): Collier Salina, MD   Encounter Date: 06/18/2020   PT End of Session - 06/18/20 1750    Visit Number 3    Number of Visits 8    Date for PT Re-Evaluation 07/08/20    Authorization Type Medicaid - [employer]    Authorization Time Period Authorization requested [place] - Visit Number 3    Authorization - Number of Visits 8    Progress Note Due on Visit 8    PT Start Time 1747    PT Stop Time 1825    PT Time Calculation (min) 38 min    Activity Tolerance Patient tolerated treatment well    Behavior During Therapy [facility] for tasks assessed/performed           Past Medical History:  Diagnosis Date  . Arthritis   . Constipation   . MRSA (methicillin resistant staph aureus) culture positive   . Myofascial pain syndrome    Myofascial pain disorder    Past Surgical History:  Procedure Laterality Date  . APPENDECTOMY N/A 02/28/2018   Procedure: APPENDECTOMY;  Surgeon: [name], MD;  Location: [facility];  Service: Gynecology;  Laterality: N/A;  . CARPAL TUNNEL RELEASE Bilateral   . CESAREAN SECTION     x 2  . HAND DEBRIDEMENT Left 09/2011   infected after splinter with MRSA  . LAPAROTOMY N/A 02/28/2018   Procedure: EXPLORATORY LAPAROTOMY, PERITONEAL BIOPSIES;  Surgeon: [name], MD;  Location: [facility];  Service: Gynecology;  Laterality: N/A;  . SALPINGOOPHORECTOMY Bilateral 02/28/2018   Procedure: SALPINGO OOPHORECTOMY;  Surgeon: [name], MD;  Location: [facility];  Service: Gynecology;  Laterality: Bilateral;  . TRIGGER FINGER RELEASE Right 2015  . TUBAL LIGATION  2005    There were no vitals filed for this visit.   Subjective Assessment - 06/18/20 1749     Subjective Pt reports she [facility] a [facility] the senior center and reports her ankle feels good today.    Pertinent History History of fall and breaking foot    Patient Stated Goals To have her ankle feel looser    Currently in Pain? No/denies              [facility] PT Assessment - 06/18/20 0001      Assessment   Medical Diagnosis Fibromyalgia; Secondary OA RT ankle and foot    Referring Provider (PT) Benjamine Mola Resa Miner, MD    Onset Date/Surgical Date --   ~40 years   Next MD Visit None    Prior Therapy Yes                         [facility] Adult PT Treatment/Exercise - 06/18/20 0001      Exercises   Exercises Ankle      Ankle Exercises: Stretches   Slant Board Stretch 3 reps;30 seconds    Other Stretch knee drive on 53GU for dorsiflexoin 5x 10"      Ankle Exercises: Standing   Vector Stance Right;Left;5 reps;5 seconds    Vector Stance Limitations Forward, side, back. 1 HHA cues to reduce trunk lean    SLS Rt 11", Lt 17" max of 5    Heel  Raises Both;15 reps    Heel Raises Limitations 1 second raise 3 second lowering    Toe Raise 15 reps    Toe Raise Limitations incline slope 3" holds    Other Standing Ankle Exercises step up 6in 1 HHA 10x      Ankle Exercises: Seated   BAPS Level 3;Sitting;10 reps    BAPS Limitations RT foot anterior/posterior, LT/RT, CW/CCW; cueingto reduce compensation with knee/ hip                  PT Education - 06/18/20 1805    Education Details Pt reports she has had edema Rt ankle for ~40 years, educated on benefits of compression garment to assist with edema control.  Measurements taken and paperwork given for [employer]    Person(s) Educated Patient    Methods Explanation;Handout    Comprehension Verbalized understanding            PT Short Term Goals - 06/12/20 0820      PT SHORT TERM GOAL #1   Title Patient will report understanding and regular compliance with HEP to improve strength, mobility and  decrease pain.    Time 2    Period Weeks    Status On-going    Target Date 06/24/20             PT Long Term Goals - 06/12/20 0820      PT LONG TERM GOAL #1   Title Patient will report improvement in overall subjective complaint of at least 25% for improved QOL.    Time 4    Period Weeks    Status On-going      PT LONG TERM GOAL #2   Title Patient will demonstrate an improvement of 1/2 MMT strength grade in RT ankle PF for improved push-off with gait.    Time 4    Period Weeks    Status On-going      PT LONG TERM GOAL #3   Title Patient will demonstrate ability to maintain SLS for at least 10 seconds indicating improved stability and safety for performing stair negotiation.    Time 4    Period Weeks    Status On-going                 Plan - 06/18/20 1806    Clinical Impression Statement Session focus wiht ankle mobility and strengthening.  Pt required verbal and tactile cueing to reduce compensation of ankle movements on BAPS with knee and hip.  Pt reports she has had edema present Rt ankle ~40 years.  Pt educated benefits with compression hose, measurements complete and paperwork given to pt for [employer]  Progressed standing exercises for gastroc and functional strengthening as well as stretches to improve dorsiflexion.  No reports of increased pain through session.    Personal Factors and Comorbidities Comorbidity 2    Comorbidities Fibromyalgia, History of cancer    Examination-Activity Limitations Stand;Locomotion Level;Transfers    Examination-Participation Restrictions Community Activity    Stability/Clinical Decision Making Stable/Uncomplicated    Clinical Decision Making Low    Rehab Potential Fair    PT Frequency 2x / week    PT Duration 4 weeks    PT Treatment/Interventions ADLs/Self Care Home Management;Aquatic Therapy;Electrical Stimulation;Moist Heat;DME Instruction;Gait training;Stair training;Functional mobility training;Therapeutic  activities;Therapeutic exercise;Balance training;Neuromuscular re-education;Patient/family education;Orthotic Fit/Training;Manual techniques;Passive range of motion;Dry needling;Taping    PT Next Visit Plan Continued to review mobility exercises as needed to reduce knee and hip compensations. Progress  standing strengthening as able.  Next session begin tandem stance and progress to step down training and leg press PRN.    PT Home Exercise Plan 06/10/20: Ankle circle, heel raise toe raises           Patient will benefit from skilled therapeutic intervention in order to improve the following deficits and impairments:  Pain, Decreased mobility, Decreased activity tolerance, Decreased range of motion, Decreased strength, Hypomobility, Decreased balance, Difficulty walking  Visit Diagnosis: Pain in right ankle and joints of right foot  Stiffness of right ankle, not elsewhere classified  Muscle weakness (generalized)     Problem List Patient Active Problem List   Diagnosis Date Noted  . Fibromyalgia 06/09/2020  . 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 Carpal tunnel syndrome, bilateral 10/03/2019  . Pelvic mass in female 02/28/2018  . Appendiceal tumor   . Peritoneal metastases ([facility])   . Pelvic mass 02/17/2018   Ihor Austin, LPTA/CLT; CBIS [phone]  Aldona Lento 06/18/2020, 6:30 PM  [facility] [address] [place], [place], [address] Phone: [phone]   Fax:  [phone]  Name: Teresa Richardson MRN: [id number] Date of Birth: [birth year]

## 2020-06-19 ENCOUNTER — Encounter (HOSPITAL_COMMUNITY): Payer: Medicaid Other | Admitting: Physical Therapy

## 2020-06-20 ENCOUNTER — Other Ambulatory Visit: Payer: Self-pay

## 2020-06-20 ENCOUNTER — Ambulatory Visit (HOSPITAL_COMMUNITY): Payer: Medicaid Other

## 2020-06-20 ENCOUNTER — Encounter (HOSPITAL_COMMUNITY): Payer: Self-pay

## 2020-06-20 DIAGNOSIS — M25571 Pain in right ankle and joints of right foot: Secondary | ICD-10-CM

## 2020-06-20 DIAGNOSIS — M25671 Stiffness of right ankle, not elsewhere classified: Secondary | ICD-10-CM

## 2020-06-20 DIAGNOSIS — M6281 Muscle weakness (generalized): Secondary | ICD-10-CM

## 2020-06-20 NOTE — Patient Instructions (Signed)
Tandem Stance    Right foot in front of left, heel touching toe both feet "straight ahead". Stand on Foot Triangle of Support with both feet. Balance in this position 30 seconds. Do with left foot in front of right.  Copyright  VHI. All rights reserved.   Toe / Heel Raise (Standing)    Standing with support, raise heels, then rock back on heels and raise toes. Repeat 15 times.  Copyright  VHI. All rights reserved.   Single Leg Balance: Eyes Open    Stand on right leg with eyes open. Hold 30 seconds. 5 times per day.  http://ggbe.exer.us/5   Copyright  VHI. All rights reserved.

## 2020-06-20 NOTE — Therapy (Signed)
[place], [place], [address] Phone: [phone]   Fax:  [phone]  Physical Therapy Treatment  Patient Details  Name: Teresa Richardson MRN: [id number] Date of Birth: 1966-03-09 Referring Provider (PT): Collier Salina, MD   Encounter Date: 06/20/2020   PT End of Session - 06/20/20 1411    Visit Number 4    Number of Visits 8    Date for PT Re-Evaluation 07/08/20    Authorization Type Medicaid - [employer]    Authorization Time Period Authorization requested [place] - Visit Number 4    Authorization - Number of Visits 8    Progress Note Due on Visit 8    PT Start Time 6629    PT Stop Time 1437    PT Time Calculation (min) 40 min    Activity Tolerance Patient tolerated treatment well    Behavior During Therapy [facility] for tasks assessed/performed           Past Medical History:  Diagnosis Date  . Arthritis   . Constipation   . MRSA (methicillin resistant staph aureus) culture positive   . Myofascial pain syndrome    Myofascial pain disorder    Past Surgical History:  Procedure Laterality Date  . APPENDECTOMY N/A 02/28/2018   Procedure: APPENDECTOMY;  Surgeon: [name], MD;  [facility];  Service: Gynecology;  Laterality: N/A;  . CARPAL TUNNEL RELEASE Bilateral   . CESAREAN SECTION     x 2  . HAND DEBRIDEMENT Left 09/2011   infected after splinter with MRSA  . LAPAROTOMY N/A 02/28/2018   Procedure: EXPLORATORY LAPAROTOMY, PERITONEAL BIOPSIES;  Surgeon: [name], MD;  [facility];  Service: Gynecology;  Laterality: N/A;  . SALPINGOOPHORECTOMY Bilateral 02/28/2018   Procedure: SALPINGO OOPHORECTOMY;  Surgeon: [name], MD;  [facility];  Service: Gynecology;  Laterality: Bilateral;  . TRIGGER FINGER RELEASE Right 2015  . TUBAL LIGATION  2005    There were no vitals filed for this visit.   Subjective Assessment - 06/20/20 1359     Subjective Reports she did a stretch class at the senior center this morning, reports her ankle feels good today.    Pertinent History History of fall and breaking foot    Patient Stated Goals To have her ankle feel looser    Currently in Pain? No/denies              [facility] PT Assessment - 06/20/20 0001      Assessment   Medical Diagnosis Fibromyalgia; Secondary OA RT ankle and foot    Referring Provider (PT) Collier Salina, MD    Next MD Visit November    Prior Therapy Yes                         [facility] Adult PT Treatment/Exercise - 06/20/20 0001      Exercises   Exercises Ankle      Ankle Exercises: Stretches   Slant Board Stretch 3 reps;30 seconds      Ankle Exercises: Standing   Vector Stance Right;Left;5 reps;5 seconds    Vector Stance Limitations Forward, side, back. 1 HHA cues to reduce trunk lean    SLS Rt 16", Lt 14" max of 5    Heel Raises Both;15 reps    Heel Raises Limitations incline slope 3" holds    Toe Raise 15 reps  Toe Raise Limitations incline slope 3" holds    Other Standing Ankle Exercises step up 6in 1 HHA 10x    Other Standing Ankle Exercises tandem stance 1x on floor;2x 30" on foam intermittent HHA      Ankle Exercises: Seated   BAPS Level 3;Sitting;10 reps    BAPS Limitations RT foot anterior/posterior, LT/RT, CW/CCW; cueingto reduce compensation with knee/ hip    Other Seated Ankle Exercises STS 10x 2                    PT Short Term Goals - 06/12/20 0820      PT SHORT TERM GOAL #1   Title Patient will report understanding and regular compliance with HEP to improve strength, mobility and decrease pain.    Time 2    Period Weeks    Status On-going    Target Date 06/24/20             PT Long Term Goals - 06/12/20 0820      PT LONG TERM GOAL #1   Title Patient will report improvement in overall subjective complaint of at least 25% for improved QOL.    Time 4    Period Weeks    Status On-going       PT LONG TERM GOAL #2   Title Patient will demonstrate an improvement of 1/2 MMT strength grade in RT ankle PF for improved push-off with gait.    Time 4    Period Weeks    Status On-going      PT LONG TERM GOAL #3   Title Patient will demonstrate ability to maintain SLS for at least 10 seconds indicating improved stability and safety for performing stair negotiation.    Time 4    Period Weeks    Status On-going                 Plan - 06/20/20 1433    Clinical Impression Statement Added tandem stance initially on static then dynamic surface required intermittent HHA.  Continued ankle mobility exercises with cueing to reduce compensation with knee.  Strengthening exercises complete with cueing for control and form.  No reports of increased pain through session, pt stated she plans to look for compression hose this weekend.    Personal Factors and Comorbidities Comorbidity 2    Comorbidities Fibromyalgia, History of cancer    Examination-Activity Limitations Stand;Locomotion Level;Transfers    Examination-Participation Restrictions Community Activity    Stability/Clinical Decision Making Stable/Uncomplicated    Clinical Decision Making Low    Rehab Potential Fair    PT Frequency 2x / week    PT Duration 4 weeks    PT Treatment/Interventions ADLs/Self Care Home Management;Aquatic Therapy;Electrical Stimulation;Moist Heat;DME Instruction;Gait training;Stair training;Functional mobility training;Therapeutic activities;Therapeutic exercise;Balance training;Neuromuscular re-education;Patient/family education;Orthotic Fit/Training;Manual techniques;Passive range of motion;Dry needling;Taping    PT Next Visit Plan Continued to review mobility exercises as needed to reduce knee and hip compensations. Progress standing strengthening as able.  Next session begin tandem stance and progress to step down training and leg press PRN.    PT Home Exercise Plan 06/10/20: Ankle circle, heel raise toe  raises; 10/15: tandem stance, SLS, heel/toe raises standing           Patient will benefit from skilled therapeutic intervention in order to improve the following deficits and impairments:  Pain, Decreased mobility, Decreased activity tolerance, Decreased range of motion, Decreased strength, Hypomobility, Decreased balance, Difficulty walking  Visit Diagnosis: Pain in right ankle and  joints of right foot  Stiffness of right ankle, not elsewhere classified  Muscle weakness (generalized)     Problem List Patient Active Problem List   Diagnosis Date Noted  . Fibromyalgia 06/09/2020  . 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 Carpal tunnel syndrome, bilateral 10/03/2019  . Pelvic mass in female 02/28/2018  . Appendiceal tumor   . Peritoneal metastases ([place])   . Pelvic mass 02/17/2018   Ihor Austin, LPTA/CLT; CBIS [phone]  Aldona Lento 06/20/2020, 3:06 PM  [facility] [address] [place], [place], [address] Phone: [phone]   Fax:  [phone]  Name: Teresa Richardson MRN: [id number] Date of Birth: [birth year]

## 2020-06-24 ENCOUNTER — Ambulatory Visit (HOSPITAL_COMMUNITY): Payer: Medicaid Other

## 2020-06-24 ENCOUNTER — Encounter (HOSPITAL_COMMUNITY): Payer: Self-pay

## 2020-06-24 ENCOUNTER — Other Ambulatory Visit: Payer: Self-pay

## 2020-06-24 DIAGNOSIS — M25571 Pain in right ankle and joints of right foot: Secondary | ICD-10-CM

## 2020-06-24 DIAGNOSIS — M25671 Stiffness of right ankle, not elsewhere classified: Secondary | ICD-10-CM

## 2020-06-24 DIAGNOSIS — M6281 Muscle weakness (generalized): Secondary | ICD-10-CM

## 2020-06-24 NOTE — Patient Instructions (Signed)
Gastroc Stretch    Stand with right foot back, leg straight, forward leg bent. Keeping heel on floor, turned slightly out, lean into wall until stretch is felt in calf. Hold 30 seconds. Repeat 3 times per set. Do 2 sets per  day.  http://orth.exer.us/27   Copyright  VHI. All rights reserved.

## 2020-06-24 NOTE — Therapy (Signed)
[place], [place], [address] Phone: [phone]   Fax:  [phone]  Physical Therapy Treatment  Patient Details  Name: Teresa Richardson MRN: [id number] Date of Birth: [birth year] Referring Provider (PT): Collier Salina, MD   Encounter Date: 06/24/2020   PT End of Session - 06/24/20 0838    Visit Number 5    Number of Visits 8    Date for PT Re-Evaluation 07/08/20    Authorization Type Medicaid - [employer]    Authorization Time Period 8 visit from 10/06-->07/08/20    Authorization - Visit Number 4    Authorization - Number of Visits 8    Progress Note Due on Visit 8    PT Start Time 0831    PT Stop Time 0910    PT Time Calculation (min) 39 min    Activity Tolerance Patient tolerated treatment well    Behavior During Therapy [facility] for tasks assessed/performed           Past Medical History:  Diagnosis Date  . Arthritis   . Constipation   . MRSA (methicillin resistant staph aureus) culture positive   . Myofascial pain syndrome    Myofascial pain disorder    Past Surgical History:  Procedure Laterality Date  . APPENDECTOMY N/A 02/28/2018   Procedure: APPENDECTOMY;  Surgeon: [name], MD;  [facility]: [facility];  Service: Gynecology;  Laterality: N/A;  . CARPAL TUNNEL RELEASE Bilateral   . CESAREAN SECTION     x 2  . HAND DEBRIDEMENT Left 09/2011   infected after splinter with MRSA  . LAPAROTOMY N/A 02/28/2018   Procedure: EXPLORATORY LAPAROTOMY, PERITONEAL BIOPSIES;  Surgeon: [name], MD;  [facility]: [facility];  Service: Gynecology;  Laterality: N/A;  . SALPINGOOPHORECTOMY Bilateral 02/28/2018   Procedure: SALPINGO OOPHORECTOMY;  Surgeon: [name], MD;  [facility];  Service: Gynecology;  Laterality: Bilateral;  . TRIGGER FINGER RELEASE Right 2015  . TUBAL LIGATION  2005    There were no vitals filed for this visit.   Subjective Assessment - 06/24/20 0836    Subjective Pt  went on trip [place] to visit daughter and grand kids.  Pt arrived with compression socks on, reports a recent purchase.  No reports of pain today, just stiffness.    Pertinent History History of fall and breaking foot    Patient Stated Goals To have her ankle feel looser    Currently in Pain? No/denies                             [facility] Adult PT Treatment/Exercise - 06/24/20 0001      Exercises   Exercises Ankle      Ankle Exercises: Stretches   Gastroc Stretch 3 reps;30 seconds    Gastroc Stretch Limitations against wall- HEP      Ankle Exercises: Standing   BAPS Standing;Level 2;5 reps    BAPS Limitations DF/PF; Inv/Ev    Vector Stance Right;Left;5 reps;5 seconds    Vector Stance Limitations Forward, side, back. 1 HHA cues to reduce trunk lean    SLS Lt 18", Rt 15"    Rocker Board 2 minutes    Rocker Board Limitations cueing for form; DF/PF and lateral    Heel Raises Both;15 reps    Heel Raises Limitations incline slope 3" holds    Toe Raise 15 reps    Toe Raise Limitations incline slope  3" holds    Other Standing Ankle Exercises step up then down 6in no HHA 15x    Other Standing Ankle Exercises tandem stance on foam 3x 30"                    PT Short Term Goals - 06/12/20 0820      PT SHORT TERM GOAL #1   Title Patient will report understanding and regular compliance with HEP to improve strength, mobility and decrease pain.    Time 2    Period Weeks    Status On-going    Target Date 06/24/20             PT Long Term Goals - 06/12/20 0820      PT LONG TERM GOAL #1   Title Patient will report improvement in overall subjective complaint of at least 25% for improved QOL.    Time 4    Period Weeks    Status On-going      PT LONG TERM GOAL #2   Title Patient will demonstrate an improvement of 1/2 MMT strength grade in RT ankle PF for improved push-off with gait.    Time 4    Period Weeks    Status On-going      PT LONG TERM GOAL #3    Title Patient will demonstrate ability to maintain SLS for at least 10 seconds indicating improved stability and safety for performing stair negotiation.    Time 4    Period Weeks    Status On-going                 Plan - 06/24/20 0911    Clinical Impression Statement Session focus on ankle mobility, balance and strengthening.  Pt instructed gastroc stretch against wall, able to demostrate and given printout to add to HEP.  Progressed to standing BAPS board for mobility with difficulty noted, required therapist verbal and tactile cueing to assure correct form and reduce hip compensation to improve ankle movements.  Also added step down for functional strengthening and ankle mobility, minimal difficulty noted, did require cueing for control.  No reports of pain through session.    Personal Factors and Comorbidities Comorbidity 2    Comorbidities Fibromyalgia, History of cancer    Examination-Activity Limitations Stand;Locomotion Level;Transfers    Examination-Participation Restrictions Community Activity    Clinical Decision Making Low    Rehab Potential Fair    PT Frequency 2x / week    PT Duration 4 weeks    PT Treatment/Interventions ADLs/Self Care Home Management;Aquatic Therapy;Electrical Stimulation;Moist Heat;DME Instruction;Gait training;Stair training;Functional mobility training;Therapeutic activities;Therapeutic exercise;Balance training;Neuromuscular re-education;Patient/family education;Orthotic Fit/Training;Manual techniques;Passive range of motion;Dry needling;Taping    PT Next Visit Plan Continued to review mobility exercises as needed to reduce knee and hip compensations. Progress standing strengthening as able. Progress to balance master and leg press PRN.    PT Home Exercise Plan 06/10/20: Ankle circle, heel raise toe raises; 10/15: tandem stance, SLS, heel/toe raises standing; 10/19: gastroc stretch against wall.           Patient will benefit from skilled  therapeutic intervention in order to improve the following deficits and impairments:  Pain, Decreased mobility, Decreased activity tolerance, Decreased range of motion, Decreased strength, Hypomobility, Decreased balance, Difficulty walking  Visit Diagnosis: Muscle weakness (generalized)  Pain in right ankle and joints of right foot  Stiffness of right ankle, not elsewhere classified     Problem List Patient Active Problem List   Diagnosis Date  Noted  . Fibromyalgia 06/09/2020  . 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 Carpal tunnel syndrome, bilateral 10/03/2019  . Pelvic mass in female 02/28/2018  . Appendiceal tumor   . 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 Pelvic mass 02/17/2018   Ihor Austin, LPTA/CLT; CBIS [phone]  Aldona Lento 06/24/2020, 9:26 AM  [place], [place], [address] Phone: [phone]   Fax:  [phone]  Name: Teresa Richardson MRN: [id number] Date of Birth: 1966/06/29

## 2020-06-26 ENCOUNTER — Ambulatory Visit (HOSPITAL_COMMUNITY): Payer: Medicaid Other | Admitting: Physical Therapy

## 2020-06-26 ENCOUNTER — Other Ambulatory Visit: Payer: Self-pay

## 2020-06-26 DIAGNOSIS — M25571 Pain in right ankle and joints of right foot: Secondary | ICD-10-CM | POA: Diagnosis not present

## 2020-06-26 DIAGNOSIS — M25671 Stiffness of right ankle, not elsewhere classified: Secondary | ICD-10-CM

## 2020-06-26 DIAGNOSIS — M6281 Muscle weakness (generalized): Secondary | ICD-10-CM

## 2020-06-26 NOTE — Therapy (Signed)
[place], [place], [address] Phone: [phone]   Fax:  [phone]  Physical Therapy Treatment  Patient Details  Name: Teresa Richardson MRN: [id number] Date of Birth: [birth year] Referring Provider (PT): Collier Salina, MD   Encounter Date: 06/26/2020   PT End of Session - 06/26/20 0918    Visit Number 6    Number of Visits 8    Date for PT Re-Evaluation 07/08/20    Authorization Type Medicaid - [employer]    Authorization Time Period 8 visit from 10/06-->07/08/20    Authorization - Visit Number 6    Authorization - Number of Visits 8    Progress Note Due on Visit 8    PT Start Time 0832    PT Stop Time 0910    PT Time Calculation (min) 38 min    Activity Tolerance Patient tolerated treatment well    Behavior During Therapy [facility] for tasks assessed/performed           Past Medical History:  Diagnosis Date  . Arthritis   . Constipation   . MRSA (methicillin resistant staph aureus) culture positive   . Myofascial pain syndrome    Myofascial pain disorder    Past Surgical History:  Procedure Laterality Date  . APPENDECTOMY N/A 02/28/2018   Procedure: APPENDECTOMY;  Surgeon: [name], MD;  Location: [facility];  Service: Gynecology;  Laterality: N/A;  . CARPAL TUNNEL RELEASE Bilateral   . CESAREAN SECTION     x 2  . HAND DEBRIDEMENT Left 09/2011   infected after splinter with MRSA  . LAPAROTOMY N/A 02/28/2018   Procedure: EXPLORATORY LAPAROTOMY, PERITONEAL BIOPSIES;  Surgeon: [name], MD;  Location: [facility];  Service: Gynecology;  Laterality: N/A;  . SALPINGOOPHORECTOMY Bilateral 02/28/2018   Procedure: SALPINGO OOPHORECTOMY;  Surgeon: [name], MD;  Location: [facility];  Service: Gynecology;  Laterality: Bilateral;  . TRIGGER FINGER RELEASE Right 2015  . TUBAL LIGATION  2005    There were no vitals filed for this visit.   Subjective Assessment - 06/26/20 0833    Subjective Pt  states that she has been doing the exercises and has no questions    Pertinent History History of fall and breaking foot    Patient Stated Goals To have her ankle feel looser    Currently in Pain? No/denies                             [facility] Adult PT Treatment/Exercise - 06/26/20 0001      Exercises   Exercises Ankle      Ankle Exercises: Stretches   Plantar Fascia Stretch 3 reps;30 seconds    Gastroc Stretch 3 reps;30 seconds    Slant Board Stretch 3 reps;30 seconds      Ankle Exercises: Standing   BAPS Standing;Level 3;10 reps    Vector Stance Right;Left;3 reps;10 seconds    Heel Raises 15 reps    Toe Raise 15 reps    Heel Walk (Round Trip) 2    Toe Walk (Round Trip) 2    Other Standing Ankle Exercises step up and down  6in 1 HHA 10x    Other Standing Ankle Exercises tandem stance on foam 3x 30"                    PT Short Term Goals - 06/12/20 8250  PT SHORT TERM GOAL #1   Title Patient will report understanding and regular compliance with HEP to improve strength, mobility and decrease pain.    Time 2    Period Weeks    Status On-going    Target Date 06/24/20             PT Long Term Goals - 06/12/20 0820      PT LONG TERM GOAL #1   Title Patient will report improvement in overall subjective complaint of at least 25% for improved QOL.    Time 4    Period Weeks    Status On-going      PT LONG TERM GOAL #2   Title Patient will demonstrate an improvement of 1/2 MMT strength grade in RT ankle PF for improved push-off with gait.    Time 4    Period Weeks    Status On-going      PT LONG TERM GOAL #3   Title Patient will demonstrate ability to maintain SLS for at least 10 seconds indicating improved stability and safety for performing stair negotiation.    Time 4    Period Weeks    Status On-going                 Plan - 06/26/20 0919    Clinical Impression Statement PT treatment focused on both mobility and stability  of ankle adding plantar fascia stretch as well as heel and toe walk.  PT needs constant verbal cuint to engage core whenever she is completing stabilization exercises.  Pt also able to complete exercises with improved form if verbally cued to slow down.    Personal Factors and Comorbidities Comorbidity 2    Comorbidities Fibromyalgia, History of cancer    Examination-Activity Limitations Stand;Locomotion Level;Transfers    Examination-Participation Restrictions Community Activity    Rehab Potential Fair    PT Frequency 2x / week    PT Duration 4 weeks    PT Treatment/Interventions ADLs/Self Care Home Management;Aquatic Therapy;Electrical Stimulation;Moist Heat;DME Instruction;Gait training;Stair training;Functional mobility training;Therapeutic activities;Therapeutic exercise;Balance training;Neuromuscular re-education;Patient/family education;Orthotic Fit/Training;Manual techniques;Passive range of motion;Dry needling;Taping    PT Next Visit Plan Continued to review mobility exercises as needed to reduce knee and hip compensations. Progress standing strengthening as able. Progress to balance master and leg press PRN.    PT Home Exercise Plan 06/10/20: Ankle circle, heel raise toe raises; 10/15: tandem stance, SLS, heel/toe raises standing; 10/19: gastroc stretch against wall.10/20:  ankle alphabet, plantar fascia stretch           Patient will benefit from skilled therapeutic intervention in order to improve the following deficits and impairments:  Pain, Decreased mobility, Decreased activity tolerance, Decreased range of motion, Decreased strength, Hypomobility, Decreased balance, Difficulty walking  Visit Diagnosis: Muscle weakness (generalized)  Pain in right ankle and joints of right foot  Stiffness of right ankle, not elsewhere classified     Problem List Patient Active Problem List   Diagnosis Date Noted  . Fibromyalgia 06/09/2020  . Secondary osteoarthritis, right ankle and  foot 06/09/2020  . Carpal tunnel syndrome, bilateral 10/03/2019  . Pelvic mass in female 02/28/2018  . Appendiceal tumor   . Peritoneal metastases ([place])   . Pelvic mass 02/17/2018    Rayetta Humphrey, PT CLT [id number] 06/26/2020, 9:22 AM  [facility] [address] [place], [place], [address] Phone: [phone]   Fax:  [phone]  Name: Teresa Richardson MRN: [id number] Date of Birth: Jan 30, 1966

## 2020-07-01 ENCOUNTER — Other Ambulatory Visit: Payer: Self-pay

## 2020-07-01 ENCOUNTER — Ambulatory Visit (HOSPITAL_COMMUNITY): Payer: Medicaid Other

## 2020-07-01 ENCOUNTER — Encounter (HOSPITAL_COMMUNITY): Payer: Self-pay

## 2020-07-01 DIAGNOSIS — M6281 Muscle weakness (generalized): Secondary | ICD-10-CM

## 2020-07-01 DIAGNOSIS — M25571 Pain in right ankle and joints of right foot: Secondary | ICD-10-CM

## 2020-07-01 DIAGNOSIS — M25671 Stiffness of right ankle, not elsewhere classified: Secondary | ICD-10-CM

## 2020-07-01 NOTE — Therapy (Signed)
[place], [place], [address] Phone: [phone]   Fax:  [phone]  Physical Therapy Treatment  Patient Details  Name: Teresa Richardson MRN: [id number] Date of Birth: [birth year] Referring Provider (PT): Collier Salina, MD   Encounter Date: 07/01/2020   PT End of Session - 07/01/20 0838    Visit Number 7    Number of Visits 8    Date for PT Re-Evaluation 07/08/20    Authorization Type Medicaid - [employer]    Authorization Time Period 8 visit from 10/06-->07/08/20    Authorization - Visit Number 7    Authorization - Number of Visits 8    Progress Note Due on Visit 8    PT Start Time 0828    PT Stop Time 0914    PT Time Calculation (min) 46 min    Activity Tolerance Patient tolerated treatment well    Behavior During Therapy [facility] for tasks assessed/performed           Past Medical History:  Diagnosis Date  . Arthritis   . Constipation   . MRSA (methicillin resistant staph aureus) culture positive   . Myofascial pain syndrome    Myofascial pain disorder    Past Surgical History:  Procedure Laterality Date  . APPENDECTOMY N/A 02/28/2018   Procedure: APPENDECTOMY;  Surgeon: [name], MD;  [facility];  Service: Gynecology;  Laterality: N/A;  . CARPAL TUNNEL RELEASE Bilateral   . CESAREAN SECTION     x 2  . HAND DEBRIDEMENT Left 09/2011   infected after splinter with MRSA  . LAPAROTOMY N/A 02/28/2018   Procedure: EXPLORATORY LAPAROTOMY, PERITONEAL BIOPSIES;  Surgeon: [name], MD;  [facility];  Service: Gynecology;  Laterality: N/A;  . SALPINGOOPHORECTOMY Bilateral 02/28/2018   Procedure: SALPINGO OOPHORECTOMY;  Surgeon: [name], MD;  [facility];  Service: Gynecology;  Laterality: Bilateral;  . TRIGGER FINGER RELEASE Right 2015  . TUBAL LIGATION  2005    There were no vitals filed for this visit.   Subjective Assessment - 07/01/20 0829    Subjective Pt  reports she is feeling good today, no pain and no questions.  Reports she did a lot of stairs yesterday, improved confidence and mechanics    Pertinent History History of fall and breaking foot    Patient Stated Goals To have her ankle feel looser    Currently in Pain? No/denies                             [facility] Adult PT Treatment/Exercise - 07/01/20 0001      Exercises   Exercises Ankle      Ankle Exercises: Stretches   Plantar Fascia Stretch 3 reps;30 seconds    Slant Board Stretch 3 reps;30 seconds      Ankle Exercises: Machines for Strengthening   Cybex Leg Press 3Pl 10x3" holds PF      Ankle Exercises: Standing   BAPS Standing;Level 3;10 reps    BAPS Limitations DF/PF; Inv/Ev; CW/CCW    Balance Master: Limits for Stability BLE 1'13", Rt only fatigue 2'06" test not complete    Balance Master: Static Rt only A97%, B3% L8    Heel Walk (Round Trip) 2    Toe Walk (Round Trip) 2    Other Standing Ankle Exercises step up and down  6in 1 HHA 15x  PT Short Term Goals - 06/12/20 0820      PT SHORT TERM GOAL #1   Title Patient will report understanding and regular compliance with HEP to improve strength, mobility and decrease pain.    Time 2    Period Weeks    Status On-going    Target Date 06/24/20             PT Long Term Goals - 06/12/20 0820      PT LONG TERM GOAL #1   Title Patient will report improvement in overall subjective complaint of at least 25% for improved QOL.    Time 4    Period Weeks    Status On-going      PT LONG TERM GOAL #2   Title Patient will demonstrate an improvement of 1/2 MMT strength grade in RT ankle PF for improved push-off with gait.    Time 4    Period Weeks    Status On-going      PT LONG TERM GOAL #3   Title Patient will demonstrate ability to maintain SLS for at least 10 seconds indicating improved stability and safety for performing stair negotiation.    Time 4    Period Weeks     Status On-going                 Plan - 07/01/20 0857    Clinical Impression Statement Pt tolerated well towards sessoin.  Session focus on mobility and stability of ankle.  Began balance master dynamic stability and LOS testing with noted increased difficulty with SLS.  Pt required verbal cueing for stability with tasks and core engangements, added paloff during tandem stance occasional LOB required HHA.  Also added leg press this session with curing for control with movement.    Personal Factors and Comorbidities Comorbidity 2    Comorbidities Fibromyalgia, History of cancer    Examination-Activity Limitations Stand;Locomotion Level;Transfers    Examination-Participation Restrictions Community Activity    Stability/Clinical Decision Making Stable/Uncomplicated    Clinical Decision Making Low    Rehab Potential Fair    PT Frequency 2x / week    PT Duration 4 weeks    PT Treatment/Interventions ADLs/Self Care Home Management;Aquatic Therapy;Electrical Stimulation;Moist Heat;DME Instruction;Gait training;Stair training;Functional mobility training;Therapeutic activities;Therapeutic exercise;Balance training;Neuromuscular re-education;Patient/family education;Orthotic Fit/Training;Manual techniques;Passive range of motion;Dry needling;Taping    PT Next Visit Plan Review goals next session.    PT Home Exercise Plan 06/10/20: Ankle circle, heel raise toe raises; 10/15: tandem stance, SLS, heel/toe raises standing; 10/19: gastroc stretch against wall.10/20:  ankle alphabet, plantar fascia stretch           Patient will benefit from skilled therapeutic intervention in order to improve the following deficits and impairments:  Pain, Decreased mobility, Decreased activity tolerance, Decreased range of motion, Decreased strength, Hypomobility, Decreased balance, Difficulty walking  Visit Diagnosis: Pain in right ankle and joints of right foot  Stiffness of right ankle, not elsewhere  classified  Muscle weakness (generalized)     Problem List Patient Active Problem List   Diagnosis Date Noted  . Fibromyalgia 06/09/2020  . 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 Carpal tunnel syndrome, bilateral 10/03/2019  . Pelvic mass in female 02/28/2018  . Appendiceal tumor   . Peritoneal metastases ([place])   . Pelvic mass 02/17/2018   Ihor Austin, LPTA/CLT; CBIS [phone]  Aldona Lento 07/01/2020, 9:19 AM  [place], [place], [address] Phone: [phone]  Fax:  [phone]  Name: Teresa Richardson MRN: [id number] Date of Birth: 1965-10-22

## 2020-07-03 ENCOUNTER — Other Ambulatory Visit: Payer: Self-pay

## 2020-07-03 ENCOUNTER — Encounter (HOSPITAL_COMMUNITY): Payer: Self-pay | Admitting: Physical Therapy

## 2020-07-03 ENCOUNTER — Ambulatory Visit (HOSPITAL_COMMUNITY): Payer: Medicaid Other | Admitting: Physical Therapy

## 2020-07-03 DIAGNOSIS — M25571 Pain in right ankle and joints of right foot: Secondary | ICD-10-CM | POA: Diagnosis not present

## 2020-07-03 DIAGNOSIS — M6281 Muscle weakness (generalized): Secondary | ICD-10-CM

## 2020-07-03 DIAGNOSIS — M25671 Stiffness of right ankle, not elsewhere classified: Secondary | ICD-10-CM

## 2020-07-03 NOTE — Patient Instructions (Signed)
Access Code: [email or web address] URL: [email or web address] Date: 07/03/2020 Prepared by: Mitzi Hansen Evamaria Detore  Exercises Standing Anti-Rotation Press with Anchored Resistance - 1 x daily - 7 x weekly - 2 sets - 10 reps

## 2020-07-03 NOTE — Therapy (Signed)
[facility] [address] [place], [place], [address] Phone: [phone]   Fax:  [phone]  Physical Therapy Treatment/ Discharge Summary  Patient Details  Name: Teresa Richardson MRN: [id number] Date of Birth: Jan 30, 1966 Referring Provider (PT): Collier Salina, MD   Encounter Date: 07/03/2020   PHYSICAL THERAPY DISCHARGE SUMMARY  Visits from Start of Care: 8  Current functional level related to goals / functional outcomes: See below   Remaining deficits: See below   Education / Equipment: See below  Plan: Patient agrees to discharge.  Patient goals were met. Patient is being discharged due to meeting the stated rehab goals.  ?????        PT End of Session - 07/03/20 0825    Visit Number 8    Number of Visits 8    Date for PT Re-Evaluation 07/08/20    Authorization Type Medicaid - [employer]    Authorization Time Period 8 visit from 10/06-->07/08/20    Authorization - Visit Number 8    Authorization - Number of Visits 8    Progress Note Due on Visit 8    PT Start Time 0828    PT Stop Time 0855    PT Time Calculation (min) 27 min    Activity Tolerance Patient tolerated treatment well    Behavior During Therapy [facility] for tasks assessed/performed           Past Medical History:  Diagnosis Date  . Arthritis   . Constipation   . MRSA (methicillin resistant staph aureus) culture positive   . Myofascial pain syndrome    Myofascial pain disorder    Past Surgical History:  Procedure Laterality Date  . APPENDECTOMY N/A 02/28/2018   Procedure: APPENDECTOMY;  Surgeon: [name], MD;  [facility]: [facility];  Service: Gynecology;  Laterality: N/A;  . CARPAL TUNNEL RELEASE Bilateral   . CESAREAN SECTION     x 2  . HAND DEBRIDEMENT Left 09/2011   infected after splinter with MRSA  . LAPAROTOMY N/A 02/28/2018   Procedure: EXPLORATORY LAPAROTOMY, PERITONEAL BIOPSIES;  Surgeon: [name], MD;  [facility]: [facility];   Service: Gynecology;  Laterality: N/A;  . SALPINGOOPHORECTOMY Bilateral 02/28/2018   Procedure: SALPINGO OOPHORECTOMY;  Surgeon: [name], MD;  Location: [facility];  Service: Gynecology;  Laterality: Bilateral;  . TRIGGER FINGER RELEASE Right 2015  . TUBAL LIGATION  2005    There were no vitals filed for this visit.   Subjective Assessment - 07/03/20 0826    Subjective Patient states she still has issues. She had some pain walking in today. She feels comfortable making today her last day. [facility] Her home exercises are going well. She is making adaptations as needed. She is wearing some compression socks that she got at [facility]. Patient states 50% improvement since starting therapy.    Pertinent History History of fall and breaking foot    Patient Stated Goals To have her ankle feel looser    Currently in Pain? Yes    Pain Score 2     Pain Location Foot    Pain Orientation Right    Pain Descriptors / Indicators Tightness    Pain Type Chronic pain              [facility] PT Assessment - 07/03/20 0001      Assessment   Medical Diagnosis Fibromyalgia; Secondary OA RT ankle and foot    Referring Provider (  PT) Collier Salina, MD    Next MD Visit none scheduled    Prior Therapy Yes      Precautions   Precautions None      Restrictions   Weight Bearing Restrictions No      Balance Screen   Has the patient fallen in the past 6 months No    Has the patient had a decrease in activity level because of a fear of falling?  No    Is the patient reluctant to leave their home because of a fear of falling?  No      Prior Function   Level of Independence Independent      Cognition   Overall Cognitive Status Within Functional Limits for tasks assessed      Observation/Other Assessments   Observations Ambulates without AD      AROM   Right Ankle Dorsiflexion 3    Right Ankle Plantar Flexion 65    Right Ankle Inversion 20    Right Ankle  Eversion 30      Strength   Right Ankle Dorsiflexion 5/5    Right Ankle Plantar Flexion 5/5   20 heel raises about 75% ROM   Right Ankle Inversion 5/5    Right Ankle Eversion 5/5      Static Standing Balance   Static Standing Balance -  Activities  Single Leg Stance - Right Leg;Single Leg Stance - Left Leg    Static Standing - Comment/# of Minutes LT: 30 seconds RT: 26 seconds                                 PT Education - 07/03/20 0824    Education Details Patient educated on HEP, exercise mechanics, progress made, returning to PT if needed    Person(s) Educated Patient    Methods Explanation;Demonstration;Handout    Comprehension Verbalized understanding;Returned demonstration            PT Short Term Goals - 07/03/20 0834      PT SHORT TERM GOAL #1   Title Patient will report understanding and regular compliance with HEP to improve strength, mobility and decrease pain.    Time 2    Period Weeks    Status Achieved    Target Date 06/24/20             PT Long Term Goals - 07/03/20 0834      PT LONG TERM GOAL #1   Title Patient will report improvement in overall subjective complaint of at least 25% for improved QOL.    Time 4    Period Weeks    Status Achieved      PT LONG TERM GOAL #2   Title Patient will demonstrate an improvement of 1/2 MMT strength grade in RT ankle PF for improved push-off with gait.    Time 4    Period Weeks    Status Achieved      PT LONG TERM GOAL #3   Title Patient will demonstrate ability to maintain SLS for at least 10 seconds indicating improved stability and safety for performing stair negotiation.    Time 4    Period Weeks    Status Achieved                 Plan - 07/03/20 0826    Clinical Impression Statement Patient has met all short and long term goals at this time with improved  strength, balance, symptoms, and ability to complete HEP. Patient remains limited by symptoms that increase  intermittently. Patient completing HEP and is remaining active with exercises classes which will continue to benefit her. Patient educated on progress made, HEP, exercise mechanics and returning to PT if needed.  Patient discharged from physical therapy at this time.    Personal Factors and Comorbidities Comorbidity 2    Comorbidities Fibromyalgia, History of cancer    Examination-Activity Limitations Stand;Locomotion Level;Transfers    Examination-Participation Restrictions Community Activity    Stability/Clinical Decision Making Stable/Uncomplicated    Rehab Potential Fair    PT Frequency --    PT Duration --    PT Treatment/Interventions ADLs/Self Care Home Management;Aquatic Therapy;Electrical Stimulation;Moist Heat;DME Instruction;Gait training;Stair training;Functional mobility training;Therapeutic activities;Therapeutic exercise;Balance training;Neuromuscular re-education;Patient/family education;Orthotic Fit/Training;Manual techniques;Passive range of motion;Dry needling;Taping    PT Next Visit Plan n/a    PT Home Exercise Plan 06/10/20: Ankle circle, heel raise toe raises; 10/15: tandem stance, SLS, heel/toe raises standing; 10/19: gastroc stretch against wall.10/20:  ankle alphabet, plantar fascia stretch 10/28 palof in tandem           Patient will benefit from skilled therapeutic intervention in order to improve the following deficits and impairments:  Pain, Decreased mobility, Decreased activity tolerance, Decreased range of motion, Decreased strength, Hypomobility, Decreased balance, Difficulty walking  Visit Diagnosis: Pain in right ankle and joints of right foot  Stiffness of right ankle, not elsewhere classified  Muscle weakness (generalized)     Problem List Patient Active Problem List   Diagnosis Date Noted  . Fibromyalgia 06/09/2020  . 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 Carpal tunnel syndrome, bilateral 10/03/2019  . Pelvic mass in  female 02/28/2018  . Appendiceal tumor   . Peritoneal metastases ([place])   . Pelvic mass 02/17/2018   9:00 AM, 07/03/20 Mearl Latin PT, DPT Physical Therapist at [facility] [place], [place], [address] Phone: [phone]   Fax:  [phone]  Name: Teresa Richardson MRN: [id number] Date of Birth: Mar 17, 1966

## 2020-07-07 ENCOUNTER — Ambulatory Visit: Payer: Medicaid Other | Admitting: Rheumatology

## 2020-07-07 ENCOUNTER — Ambulatory Visit: Payer: Medicaid Other | Admitting: Internal Medicine

## 2020-07-08 ENCOUNTER — Ambulatory Visit (HOSPITAL_COMMUNITY): Payer: Medicaid Other | Admitting: Physical Therapy

## 2020-07-10 ENCOUNTER — Encounter (HOSPITAL_COMMUNITY): Payer: Medicaid Other | Admitting: Physical Therapy

## 2022-12-21 LAB — GLUCOSE, POCT (MANUAL RESULT ENTRY): Glucose Fasting, POC: 134 mg/dL — AB (ref 70–99)

## 2023-01-07 ENCOUNTER — Encounter: Payer: Self-pay | Admitting: *Deleted

## 2023-01-07 NOTE — Progress Notes (Signed)
Pt attended a screening event on 12/21/22 where screening results were wnl. Per Pt. Chart review, the Pt. Has not seen her PCP regularly. However, The Pt. Is receiving ongoing care within the past 12 months with multiple Oncology specialists who share the patient's visit information with her PCP. The patient does not have any SDOH needs and does not need any further assistance from the Health Equity Team at this time. Perley Jain, CCG

## 2023-01-10 ENCOUNTER — Encounter: Payer: Self-pay | Admitting: *Deleted

## 2023-01-10 NOTE — Progress Notes (Unsigned)
Pt. Screened on 4/16/H. Event-[place].The Pt. BP was 99/57. No SDOH needs from the H/Eq. Team at this time.  [name].CCG

## 2023-05-18 LAB — AMB RESULTS CONSOLE CBG: Glucose: 102

## 2023-05-18 NOTE — Progress Notes (Signed)
No new SDOH needs since 12/21/22

## 2023-06-13 ENCOUNTER — Encounter: Payer: Self-pay | Admitting: *Deleted

## 2023-06-13 NOTE — Progress Notes (Signed)
Pt attended 05/18/2023 screening event where her b/p was 98/57 and her blood sugar was 102. Pt did not identify any SDOH insecurities at the event. During the post event f/u call, pt shared that she "don't go to my PCP that often- like once a year and when I need it." Pt confirmed her PCP is Max Sane [facility]) who is a NP at the [facility] Internal Medicine clinic in [place]. Pt also notes she has had multiple urinary tract issues this year and has had "excellent care" by Dr. Precious Bard as well. No additional health equity team support indicated at this time.

## 2024-03-02 ENCOUNTER — Other Ambulatory Visit: Payer: Self-pay | Admitting: Internal Medicine

## 2024-03-02 DIAGNOSIS — Z1231 Encounter for screening mammogram for malignant neoplasm of breast: Secondary | ICD-10-CM

## 2024-03-06 ENCOUNTER — Ambulatory Visit
Admission: RE | Admit: 2024-03-06 | Discharge: 2024-03-06 | Disposition: A | Discharge status: Home / Self Care | Source: Ambulatory Visit | Attending: Student | Admitting: Student

## 2024-03-06 DIAGNOSIS — Z1231 Encounter for screening mammogram for malignant neoplasm of breast: Secondary | ICD-10-CM
